# Patient Record
Sex: Female | Born: 1945 | Race: White | Smoking: Never smoker
Health system: Southern US, Community
[De-identification: ages and names within clinical notes are randomized; demographics above are authoritative.]

## PROBLEM LIST (undated history)

## (undated) DIAGNOSIS — F319 Bipolar disorder, unspecified: Secondary | ICD-10-CM

## (undated) DIAGNOSIS — M25512 Pain in left shoulder: Secondary | ICD-10-CM

## (undated) DIAGNOSIS — K589 Irritable bowel syndrome without diarrhea: Secondary | ICD-10-CM

## (undated) DIAGNOSIS — F3289 Other specified depressive episodes: Secondary | ICD-10-CM

## (undated) DIAGNOSIS — K649 Unspecified hemorrhoids: Secondary | ICD-10-CM

## (undated) DIAGNOSIS — M25511 Pain in right shoulder: Secondary | ICD-10-CM

## (undated) DIAGNOSIS — E079 Disorder of thyroid, unspecified: Secondary | ICD-10-CM

## (undated) DIAGNOSIS — F411 Generalized anxiety disorder: Secondary | ICD-10-CM

## (undated) DIAGNOSIS — K219 Gastro-esophageal reflux disease without esophagitis: Secondary | ICD-10-CM

## (undated) DIAGNOSIS — E785 Hyperlipidemia, unspecified: Secondary | ICD-10-CM

## (undated) DIAGNOSIS — Z86010 Personal history of colonic polyps: Secondary | ICD-10-CM

## (undated) DIAGNOSIS — R197 Diarrhea, unspecified: Secondary | ICD-10-CM

## (undated) DIAGNOSIS — F329 Major depressive disorder, single episode, unspecified: Secondary | ICD-10-CM

## (undated) DIAGNOSIS — I10 Essential (primary) hypertension: Secondary | ICD-10-CM

## (undated) HISTORY — DX: Diarrhea, unspecified: R19.7

## (undated) HISTORY — DX: Major depressive disorder, single episode, unspecified: F32.9

## (undated) HISTORY — DX: Essential (primary) hypertension: I10

## (undated) HISTORY — PX: COLONOSCOPY: SHX5424

## (undated) HISTORY — PX: ESOPHAGOGASTRODUODENOSCOPY: SHX1529

## (undated) HISTORY — DX: Gastro-esophageal reflux disease without esophagitis: K21.9

## (undated) HISTORY — DX: Bipolar disorder, unspecified: F31.9

## (undated) HISTORY — DX: Irritable bowel syndrome, unspecified: K58.9

## (undated) HISTORY — DX: Personal history of colonic polyps: Z86.010

## (undated) HISTORY — DX: Pain in right shoulder: M25.511

## (undated) HISTORY — DX: Hyperlipidemia, unspecified: E78.5

## (undated) HISTORY — DX: Disorder of thyroid, unspecified: E07.9

## (undated) HISTORY — DX: Other specified depressive episodes: F32.89

## (undated) HISTORY — DX: Pain in right shoulder: M25.512

## (undated) HISTORY — DX: Generalized anxiety disorder: F41.1

## (undated) HISTORY — DX: Unspecified hemorrhoids: K64.9

---

## 1998-09-06 ENCOUNTER — Other Ambulatory Visit: Admission: RE | Admit: 1998-09-06 | Discharge: 1998-09-06 | Payer: Self-pay | Admitting: Gynecology

## 1999-10-28 ENCOUNTER — Other Ambulatory Visit: Admission: RE | Admit: 1999-10-28 | Discharge: 1999-10-28 | Payer: Self-pay | Admitting: Gynecology

## 2001-01-07 ENCOUNTER — Other Ambulatory Visit: Admission: RE | Admit: 2001-01-07 | Discharge: 2001-01-07 | Payer: Self-pay | Admitting: Gynecology

## 2002-01-27 ENCOUNTER — Other Ambulatory Visit: Admission: RE | Admit: 2002-01-27 | Discharge: 2002-01-27 | Payer: Self-pay | Admitting: Gynecology

## 2003-01-30 ENCOUNTER — Other Ambulatory Visit: Admission: RE | Admit: 2003-01-30 | Discharge: 2003-01-30 | Payer: Self-pay | Admitting: Gynecology

## 2003-08-12 DIAGNOSIS — Z860100 Personal history of colon polyps, unspecified: Secondary | ICD-10-CM

## 2003-08-12 HISTORY — DX: Personal history of colonic polyps: Z86.010

## 2003-08-12 HISTORY — DX: Personal history of colon polyps, unspecified: Z86.0100

## 2004-02-01 ENCOUNTER — Other Ambulatory Visit: Admission: RE | Admit: 2004-02-01 | Discharge: 2004-02-01 | Payer: Self-pay | Admitting: Gynecology

## 2004-02-05 ENCOUNTER — Encounter (INDEPENDENT_AMBULATORY_CARE_PROVIDER_SITE_OTHER): Payer: Self-pay | Admitting: *Deleted

## 2004-03-05 ENCOUNTER — Encounter: Admission: RE | Admit: 2004-03-05 | Discharge: 2004-03-05 | Payer: Self-pay | Admitting: Internal Medicine

## 2004-07-17 ENCOUNTER — Ambulatory Visit: Payer: Self-pay | Admitting: Gastroenterology

## 2004-09-04 ENCOUNTER — Ambulatory Visit: Payer: Self-pay | Admitting: Gastroenterology

## 2005-02-03 ENCOUNTER — Other Ambulatory Visit: Admission: RE | Admit: 2005-02-03 | Discharge: 2005-02-03 | Payer: Self-pay | Admitting: Gynecology

## 2006-04-04 ENCOUNTER — Emergency Department (HOSPITAL_COMMUNITY): Admission: EM | Admit: 2006-04-04 | Discharge: 2006-04-04 | Payer: Self-pay | Admitting: Emergency Medicine

## 2006-05-11 ENCOUNTER — Ambulatory Visit: Payer: Self-pay | Admitting: Gastroenterology

## 2006-06-22 ENCOUNTER — Ambulatory Visit: Payer: Self-pay | Admitting: Gastroenterology

## 2007-01-21 ENCOUNTER — Ambulatory Visit: Payer: Self-pay | Admitting: Gastroenterology

## 2007-01-21 LAB — CONVERTED CEMR LAB: Folate: >20 ng/mL

## 2008-03-14 ENCOUNTER — Telehealth: Payer: Self-pay | Admitting: Internal Medicine

## 2008-05-11 ENCOUNTER — Encounter: Admission: RE | Admit: 2008-05-11 | Discharge: 2008-05-11 | Payer: Self-pay | Admitting: Internal Medicine

## 2008-05-11 ENCOUNTER — Ambulatory Visit: Payer: Self-pay | Admitting: Internal Medicine

## 2008-05-12 ENCOUNTER — Encounter: Admission: RE | Admit: 2008-05-12 | Discharge: 2008-05-12 | Payer: Self-pay | Admitting: Internal Medicine

## 2008-05-23 ENCOUNTER — Other Ambulatory Visit: Admission: RE | Admit: 2008-05-23 | Discharge: 2008-05-23 | Payer: Self-pay | Admitting: Internal Medicine

## 2008-05-23 ENCOUNTER — Ambulatory Visit: Payer: Self-pay | Admitting: Internal Medicine

## 2008-05-29 ENCOUNTER — Ambulatory Visit: Payer: Self-pay | Admitting: Internal Medicine

## 2008-06-09 ENCOUNTER — Ambulatory Visit: Payer: Self-pay | Admitting: Internal Medicine

## 2008-06-09 ENCOUNTER — Other Ambulatory Visit: Admission: RE | Admit: 2008-06-09 | Discharge: 2008-06-09 | Payer: Self-pay | Admitting: Internal Medicine

## 2008-06-19 DIAGNOSIS — F329 Major depressive disorder, single episode, unspecified: Secondary | ICD-10-CM | POA: Insufficient documentation

## 2008-06-19 DIAGNOSIS — Z860100 Personal history of colon polyps, unspecified: Secondary | ICD-10-CM | POA: Insufficient documentation

## 2008-06-19 DIAGNOSIS — F411 Generalized anxiety disorder: Secondary | ICD-10-CM | POA: Insufficient documentation

## 2008-06-19 DIAGNOSIS — R197 Diarrhea, unspecified: Secondary | ICD-10-CM | POA: Insufficient documentation

## 2008-06-19 DIAGNOSIS — K219 Gastro-esophageal reflux disease without esophagitis: Secondary | ICD-10-CM | POA: Insufficient documentation

## 2008-06-19 DIAGNOSIS — F319 Bipolar disorder, unspecified: Secondary | ICD-10-CM | POA: Insufficient documentation

## 2008-06-19 DIAGNOSIS — K649 Unspecified hemorrhoids: Secondary | ICD-10-CM | POA: Insufficient documentation

## 2008-06-20 ENCOUNTER — Telehealth: Payer: Self-pay | Admitting: Gastroenterology

## 2008-06-20 ENCOUNTER — Ambulatory Visit: Payer: Self-pay | Admitting: Gastroenterology

## 2008-06-23 ENCOUNTER — Ambulatory Visit (HOSPITAL_COMMUNITY): Admission: RE | Admit: 2008-06-23 | Discharge: 2008-06-23 | Payer: Self-pay | Admitting: Gastroenterology

## 2008-11-23 ENCOUNTER — Ambulatory Visit: Payer: Self-pay | Admitting: Internal Medicine

## 2009-07-20 ENCOUNTER — Ambulatory Visit: Payer: Self-pay | Admitting: Internal Medicine

## 2009-07-20 ENCOUNTER — Other Ambulatory Visit: Admission: RE | Admit: 2009-07-20 | Discharge: 2009-07-20 | Payer: Self-pay | Admitting: Internal Medicine

## 2010-02-07 ENCOUNTER — Ambulatory Visit: Payer: Self-pay | Admitting: Internal Medicine

## 2010-05-16 ENCOUNTER — Ambulatory Visit: Payer: Self-pay | Admitting: Internal Medicine

## 2010-06-14 ENCOUNTER — Ambulatory Visit: Payer: Self-pay | Admitting: Internal Medicine

## 2010-07-19 ENCOUNTER — Ambulatory Visit: Payer: Self-pay | Admitting: Internal Medicine

## 2010-07-22 ENCOUNTER — Ambulatory Visit: Payer: Self-pay | Admitting: Internal Medicine

## 2010-10-18 ENCOUNTER — Other Ambulatory Visit: Payer: Self-pay | Admitting: Internal Medicine

## 2010-10-21 ENCOUNTER — Encounter (INDEPENDENT_AMBULATORY_CARE_PROVIDER_SITE_OTHER): Payer: Medicare Other | Admitting: Internal Medicine

## 2010-10-21 DIAGNOSIS — Z23 Encounter for immunization: Secondary | ICD-10-CM

## 2010-10-21 DIAGNOSIS — Z0000 Encounter for general adult medical examination without abnormal findings: Secondary | ICD-10-CM

## 2010-11-29 ENCOUNTER — Ambulatory Visit: Payer: Medicare Other | Admitting: Gastroenterology

## 2010-12-19 ENCOUNTER — Ambulatory Visit (INDEPENDENT_AMBULATORY_CARE_PROVIDER_SITE_OTHER): Payer: Medicare Other | Admitting: Gastroenterology

## 2010-12-19 ENCOUNTER — Encounter: Payer: Self-pay | Admitting: Gastroenterology

## 2010-12-19 ENCOUNTER — Other Ambulatory Visit (INDEPENDENT_AMBULATORY_CARE_PROVIDER_SITE_OTHER): Payer: Medicare Other

## 2010-12-19 DIAGNOSIS — R110 Nausea: Secondary | ICD-10-CM

## 2010-12-19 DIAGNOSIS — K589 Irritable bowel syndrome without diarrhea: Secondary | ICD-10-CM

## 2010-12-19 LAB — IGA: IgA: 347 mg/dL (ref 68–378)

## 2010-12-19 MED ORDER — ONDANSETRON HCL 4 MG PO TABS: 4.0000 mg | ORAL_TABLET | Freq: Three times a day (TID) | ORAL | Status: AC | PRN

## 2010-12-19 MED ORDER — HYOSCYAMINE SULFATE 0.125 MG SL SUBL: 0.1250 mg | SUBLINGUAL_TABLET | SUBLINGUAL | Status: AC | PRN

## 2010-12-19 NOTE — Patient Instructions (Signed)
Your prescription(s) have been sent to you pharmacy.  Please go to the basement today for your labs, iFOB.
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
## 2010-12-20 ENCOUNTER — Encounter: Payer: Self-pay | Admitting: Gastroenterology

## 2010-12-20 LAB — GLIA (IGA/G) + TTG IGA
Gliadin IgA: 8.9 U/mL (ref <20)
Gliadin IgG: 5.8 U/mL (ref <20)
Tissue Transglutaminase Ab, IgA: 5 U/mL (ref <20)

## 2010-12-24 ENCOUNTER — Other Ambulatory Visit: Payer: Medicare Other

## 2010-12-27 NOTE — Assessment & Plan Note (Signed)
[facility]                           GASTROENTEROLOGY OFFICE NOTE   NAME:Paccione, [name] [name]                        MRN:          [id number]  DATE:06/22/2006                            DOB:          April 19, 1946    [name] comes in saying she is doing pretty well.  Had a severe episode of  diarrhea in August.  I had lab tests from then showing that her hemoglobin  was 19, when she was amazingly dehydrated.  I am concerned because her  memory does not seem to be great, and her affect and emotions seem to be  really atypical, certainly not the norm.  When she took the Xifaxan and the  probiotic, Flora-Q, she seemed to do better.  She was on a higher dose than  I am going to prescribe for her.   PHYSICAL EXAMINATION:  VITAL SIGNS:  She weighed 143.  Blood pressure  120/72, pulse 80 and regular.  __________ unremarkable.   IMPRESSION:  1. Irritable bowel syndrome with diarrheal component, responding to      Xifaxan and Flora-Q in the past.  2. Anxiety and depression.  3. Gastroesophageal reflux disease.   RECOMMENDATIONS:  1. Use Xifaxan 200 mg b.i.d. x10 days.  2. Flora-Q for several months if need be to get her symptoms under      control.  Sometimes this is effective for irritable bowel syndrome.  3. I have told her to call us if she is not any better or if she has any      episodes like she had when she was in the hospital, where she was      terribly sick and dehydrated and was given 2 units of fluids.  We did      do some repeat lab studies on her today, just to make sure that her      laboratory studies have returned to normal.     Ulyess Mort, MD  Electronically Signed    SML/MedQ  DD: 06/22/2006  DT: 06/22/2006  Job #: [id number]

## 2010-12-27 NOTE — Assessment & Plan Note (Signed)
[name]                           GASTROENTEROLOGY OFFICE NOTE   NAME:[name] [name] JOENS                        MRN:          [id number]  DATE:05/11/2006                            DOB:          [birth year]    PROBLEM:  Diarrhea, gas and bloating.   HISTORY:  [name] [name] is a pleasant 65 year old white female known to Dr. Victorino Dike who has been followed for IBS.  She had undergone colonoscopy in  2005 per Dr. Noe Gens in [facility] for complaints of diarrhea and rectal  discomfort.  She was noted to have internal hemorrhoids which were  coagulated and rubber banded and had two polyps which were hyperplastic.  The patient says that she has had problems with diarrhea over the past 4-5  years and has pretty much chronic symptoms.  She will have 3-4 loose stools  daily, no cramping generally, she does have gas and frequent bloating.  She  had an episode at the end of August 2007 with an abrupt increase in her  symptoms with severe diarrhea, nausea and vomiting.  She attributes this to  a food poisoning and says that her symptoms lasted about 4-5 days and then  have returned to her baseline.  She has not had any recent antibiotic  therapy, no new medications other than Synthroid.  She comes in for followup  after this recent exacerbation and to see if there was anything that would  help with her symptoms.   CURRENT MEDICATIONS:  1. Depakote 250 one p.o. every morning and 2 p.o. every evening.  2. Nortriptyline 75 every night.  3. Fem HRT daily.  4. Propranolol 10 mg daily.  5. Calcium with vitamin D 2 p.o. daily.  6. Temazepam 15 two p.o. nightly.  7. Nefazodone 100 mg two of them nightly.  8. Synthroid she is uncertain of the dosage.   ALLERGIES:  No known drug allergies.   EXAM:  Well-developed white female in no acute distress.  The weight is 144.  Blood pressure 120/80.  Pulse is 78.  CARDIOVASCULAR:  Regular rate and rhythm with S1 and S2.  ABDOMEN:  Is soft, bowel sounds are active.  She is nontender.  There is no  mass or hepatosplenomegaly.   IMPRESSION:  A 65 year old white female with chronic diarrhea with  exacerbation as well as gas and bloating.  I believe she did have a recent  infectious gastroenteritis and may have a component of bacterial overgrowth  aggravating her irritable bowel symptoms.  She has not been ruled out for  celiac disease and this should be done, also wonder about underlying  microscopic colitis.   PLAN:  A trial of Xifaxan 400 mg t.i.d. x10 days and Align 1 p.o. daily.  1. Check celiac panel.  2. Return office visit in 4-6 weeks and depending on her response to the      Xifaxan and Align we will decide regarding repeat colonoscopy with      random biopsies.      ______________________________  Mike Gip, PA-C    ______________________________  Ulyess Mort, MD  AE/MedQ  DD:  05/11/2006  DT:  05/12/2006  Job #:  [id number]

## 2011-01-31 ENCOUNTER — Telehealth: Payer: Self-pay | Admitting: Gastroenterology

## 2011-01-31 ENCOUNTER — Telehealth: Payer: Self-pay | Admitting: Internal Medicine

## 2011-01-31 NOTE — Telephone Encounter (Signed)
Left message for pt to call back.  Pt states that her brother died a couple of days ago and his liver biopsy showed that he had hemochromatosis. Pt would like to be tested for this. Instructed pt to call her primary care physician and discuss this with him and she could have the labs drawn by that office. Pt verbalized understanding.

## 2011-01-31 NOTE — Telephone Encounter (Signed)
Ok per  Dr. Lenord Fellers for patient to come in and have ferritin level drawn. Patient informed.

## 2011-02-03 ENCOUNTER — Other Ambulatory Visit: Payer: Medicare Other | Admitting: Internal Medicine

## 2011-02-03 DIAGNOSIS — R5383 Other fatigue: Secondary | ICD-10-CM

## 2011-02-03 DIAGNOSIS — E039 Hypothyroidism, unspecified: Secondary | ICD-10-CM

## 2011-02-03 DIAGNOSIS — D649 Anemia, unspecified: Secondary | ICD-10-CM

## 2011-02-03 DIAGNOSIS — Z79899 Other long term (current) drug therapy: Secondary | ICD-10-CM

## 2011-02-03 DIAGNOSIS — R5381 Other malaise: Secondary | ICD-10-CM

## 2011-02-03 DIAGNOSIS — Z8349 Family history of other endocrine, nutritional and metabolic diseases: Secondary | ICD-10-CM

## 2011-02-04 ENCOUNTER — Encounter: Payer: Self-pay | Admitting: Internal Medicine

## 2011-02-25 ENCOUNTER — Other Ambulatory Visit: Payer: Self-pay | Admitting: Internal Medicine

## 2011-02-26 ENCOUNTER — Other Ambulatory Visit: Payer: Self-pay

## 2011-02-26 MED ORDER — LEVOTHYROXINE SODIUM 25 MCG PO TABS: 25.0000 ug | ORAL_TABLET | Freq: Every day | ORAL | Status: DC

## 2011-02-26 NOTE — Telephone Encounter (Signed)
rx for synthroid 25 mcg 1 po daily #30/5 refills sent to pharmacy

## 2011-04-28 ENCOUNTER — Encounter: Payer: Self-pay | Admitting: Internal Medicine

## 2011-04-29 ENCOUNTER — Other Ambulatory Visit: Payer: Self-pay | Admitting: Internal Medicine

## 2011-04-29 ENCOUNTER — Ambulatory Visit (INDEPENDENT_AMBULATORY_CARE_PROVIDER_SITE_OTHER): Payer: Medicare Other | Admitting: Internal Medicine

## 2011-04-29 ENCOUNTER — Encounter: Payer: Self-pay | Admitting: Internal Medicine

## 2011-04-29 VITALS — BP 116/60 | HR 72 | Temp 98.6°F | Ht 64.0 in | Wt 127.0 lb

## 2011-04-29 DIAGNOSIS — F319 Bipolar disorder, unspecified: Secondary | ICD-10-CM

## 2011-04-29 DIAGNOSIS — I10 Essential (primary) hypertension: Secondary | ICD-10-CM | POA: Insufficient documentation

## 2011-04-29 DIAGNOSIS — E039 Hypothyroidism, unspecified: Secondary | ICD-10-CM

## 2011-04-29 DIAGNOSIS — E785 Hyperlipidemia, unspecified: Secondary | ICD-10-CM

## 2011-04-29 DIAGNOSIS — Z23 Encounter for immunization: Secondary | ICD-10-CM

## 2011-04-29 LAB — LIPID PANEL
Cholesterol: 238 mg/dL — ABNORMAL HIGH (ref 0–200)
LDL Cholesterol: 134 mg/dL — ABNORMAL HIGH (ref 0–99)
Total CHOL/HDL Ratio: 5.7 Ratio
VLDL: 62 mg/dL — ABNORMAL HIGH (ref 0–40)

## 2011-04-29 LAB — TSH: TSH: 2.465 u[IU]/mL (ref 0.350–4.500)

## 2011-04-29 NOTE — Progress Notes (Signed)
  Subjective:    Patient ID: [name] [name], female    DOB: [birth year], 65 y.o.   MRN: [id number]  HPI For follow up of hypothyroidism, HTN and influenza immunization. Dr. Donell Beers started her on Doxepin for sleep recently. No other complaints other than insomnia. BP stable on Cozaar. TSH drawn today on Synthroid.    Review of Systems     Objective:   Physical Exam Neck-no thyromegaly; Chest clear; Cor RRR        Assessment & Plan:  HTN Hypothyroidism Bipolar disorder RTC in 6 months for PE without PAP. Influenza immunization given today.

## 2011-06-20 ENCOUNTER — Other Ambulatory Visit: Payer: Self-pay | Admitting: Internal Medicine

## 2011-06-23 ENCOUNTER — Other Ambulatory Visit: Payer: Self-pay | Admitting: Internal Medicine

## 2011-07-10 ENCOUNTER — Ambulatory Visit (INDEPENDENT_AMBULATORY_CARE_PROVIDER_SITE_OTHER): Payer: Medicare Other | Admitting: Internal Medicine

## 2011-07-10 ENCOUNTER — Encounter: Payer: Self-pay | Admitting: Internal Medicine

## 2011-07-10 VITALS — BP 116/74 | HR 84 | Temp 98.1°F | Wt 130.0 lb

## 2011-07-10 DIAGNOSIS — J329 Chronic sinusitis, unspecified: Secondary | ICD-10-CM

## 2011-07-10 DIAGNOSIS — J309 Allergic rhinitis, unspecified: Secondary | ICD-10-CM

## 2011-07-10 NOTE — Progress Notes (Signed)
  Subjective:    Patient ID: [name] [name], female    DOB: 1946/04/06, 65 y.o.   MRN: [id number]  HPI 65 year old white female in today for an acute visit. Says she has had nasal congestion and hoarseness for for 5 weeks. Started when least began to fall and heat came on in the home. History of allergic rhinitis. Has had some discolored nasal drainage. Some maxillary sinus tenderness. No fever or chills.    Review of Systems     Objective:   Physical Exam HEENT exam: TMs are clear, pharynx is clear. Neck is supple; chest clear to auscultation; boggy nasal mucosa        Assessment & Plan:  Sinusitis  Allergic rhinitis  Plan: Sample of Veramyst nasal spray to use in each nostril daily. Zithromax Z-PAK 2 tablets by mouth day one followed by 1 tablet by mouth days 2 through 5 with 1 refill. If not better in one week have prescription refilled. Her daughter is expecting twin boys in the next few weeks.

## 2011-07-10 NOTE — Patient Instructions (Signed)
Take Zithromax Z-PAK 2 tablets by mouth day one followed by 1 tablet by mouth days 2 through 5. Have prescription refill in one week if you are not well. Use Veramyst nasal spray in each nostril daily.

## 2011-08-07 ENCOUNTER — Ambulatory Visit (INDEPENDENT_AMBULATORY_CARE_PROVIDER_SITE_OTHER): Payer: Medicare Other | Admitting: Internal Medicine

## 2011-08-07 ENCOUNTER — Encounter: Payer: Self-pay | Admitting: Internal Medicine

## 2011-08-07 VITALS — BP 102/60 | HR 84 | Temp 99.0°F | Wt 127.0 lb

## 2011-08-07 DIAGNOSIS — J40 Bronchitis, not specified as acute or chronic: Secondary | ICD-10-CM

## 2011-08-07 DIAGNOSIS — J111 Influenza due to unidentified influenza virus with other respiratory manifestations: Secondary | ICD-10-CM

## 2011-08-07 NOTE — Progress Notes (Signed)
  Subjective:    Patient ID: [name] [name], female    DOB: 16-Oct-1945, 65 y.o.   MRN: [id number]  HPI Although patient took influenza immunization here in this office,  she came down with flu symptoms this past weekend. Her sister had same illness and they spent time together in [place] exchanging Christmas presents on December 22. Had fever chills myalgias and nausea. Still feels fatigued. Has developed nonproductive cough. Daughter recently delivered twins. She has been reluctant to visit them.    Review of Systems     Objective:   Physical Exam HEENT exam: Pharynx very slightly injected; TMs are clear; neck supple; chest clear        Assessment & Plan:  Influenza  Bronchitis resulting from influenza  Plan: Zithromax Z-Pak 2 tablets by mouth day one followed by 1 tablet by mouth days 2 through 5. Hycodan cough syrup 8 ounces 1 teaspoon by mouth every 6 hours when necessary cough. Call if not better in one week

## 2011-08-07 NOTE — Patient Instructions (Signed)
Take Zithromax Z-PAK 2 tablets on day one followed by 1 tablet on days 2 through 5. Take Hycodan cough syrup every 6 hours if needed for cough. Call if not better in one week or sooner if worse.

## 2011-08-18 ENCOUNTER — Other Ambulatory Visit: Payer: Self-pay | Admitting: Internal Medicine

## 2011-08-20 DIAGNOSIS — Z1231 Encounter for screening mammogram for malignant neoplasm of breast: Secondary | ICD-10-CM | POA: Diagnosis not present

## 2011-09-01 ENCOUNTER — Encounter: Payer: Self-pay | Admitting: Internal Medicine

## 2011-10-29 DIAGNOSIS — F3342 Major depressive disorder, recurrent, in full remission: Secondary | ICD-10-CM | POA: Diagnosis not present

## 2011-10-30 ENCOUNTER — Other Ambulatory Visit: Payer: Medicare Other | Admitting: Internal Medicine

## 2011-10-31 ENCOUNTER — Encounter: Payer: Medicare Other | Admitting: Internal Medicine

## 2011-11-17 DIAGNOSIS — M25519 Pain in unspecified shoulder: Secondary | ICD-10-CM | POA: Diagnosis not present

## 2011-11-19 DIAGNOSIS — M25519 Pain in unspecified shoulder: Secondary | ICD-10-CM | POA: Diagnosis not present

## 2011-11-25 DIAGNOSIS — M25519 Pain in unspecified shoulder: Secondary | ICD-10-CM | POA: Diagnosis not present

## 2011-11-25 DIAGNOSIS — M25559 Pain in unspecified hip: Secondary | ICD-10-CM | POA: Diagnosis not present

## 2011-11-28 ENCOUNTER — Other Ambulatory Visit: Payer: Medicare Other | Admitting: Internal Medicine

## 2011-11-28 DIAGNOSIS — I10 Essential (primary) hypertension: Secondary | ICD-10-CM

## 2011-11-28 DIAGNOSIS — E559 Vitamin D deficiency, unspecified: Secondary | ICD-10-CM | POA: Diagnosis not present

## 2011-11-28 DIAGNOSIS — E785 Hyperlipidemia, unspecified: Secondary | ICD-10-CM | POA: Diagnosis not present

## 2011-11-28 DIAGNOSIS — E7800 Pure hypercholesterolemia, unspecified: Secondary | ICD-10-CM

## 2011-11-28 DIAGNOSIS — M25519 Pain in unspecified shoulder: Secondary | ICD-10-CM | POA: Diagnosis not present

## 2011-11-28 DIAGNOSIS — E039 Hypothyroidism, unspecified: Secondary | ICD-10-CM

## 2011-11-28 DIAGNOSIS — Z0000 Encounter for general adult medical examination without abnormal findings: Secondary | ICD-10-CM

## 2011-11-28 DIAGNOSIS — F411 Generalized anxiety disorder: Secondary | ICD-10-CM

## 2011-11-28 DIAGNOSIS — Z79899 Other long term (current) drug therapy: Secondary | ICD-10-CM

## 2011-11-28 DIAGNOSIS — R110 Nausea: Secondary | ICD-10-CM

## 2011-11-28 DIAGNOSIS — F329 Major depressive disorder, single episode, unspecified: Secondary | ICD-10-CM

## 2011-11-28 DIAGNOSIS — M25559 Pain in unspecified hip: Secondary | ICD-10-CM | POA: Diagnosis not present

## 2011-11-28 DIAGNOSIS — F319 Bipolar disorder, unspecified: Secondary | ICD-10-CM

## 2011-11-29 LAB — CBC WITH DIFFERENTIAL/PLATELET
Basophils Absolute: 0 10*3/uL (ref 0.0–0.1)
Eosinophils Relative: 3 % (ref 0–5)
Lymphocytes Relative: 41 % (ref 12–46)
MCH: 30 pg (ref 26.0–34.0)
MCV: 98.4 fL (ref 78.0–100.0)
Monocytes Absolute: 0.5 10*3/uL (ref 0.1–1.0)
Neutro Abs: 2.6 10*3/uL (ref 1.7–7.7)
Neutrophils Relative %: 46 % (ref 43–77)
Platelets: 213 10*3/uL (ref 150–400)

## 2011-11-29 LAB — LIPID PANEL
Cholesterol: 229 mg/dL — ABNORMAL HIGH (ref 0–200)
Total CHOL/HDL Ratio: 6.4 Ratio
Triglycerides: 414 mg/dL — ABNORMAL HIGH (ref <150)

## 2011-11-29 LAB — COMPREHENSIVE METABOLIC PANEL
ALT: 17 U/L (ref 0–35)
Alkaline Phosphatase: 72 U/L (ref 39–117)
CO2: 28 mEq/L (ref 19–32)
Chloride: 103 mEq/L (ref 96–112)
Creat: 0.94 mg/dL (ref 0.50–1.10)
Sodium: 140 mEq/L (ref 135–145)
Total Protein: 6.6 g/dL (ref 6.0–8.3)

## 2011-12-01 ENCOUNTER — Encounter: Payer: Medicare Other | Admitting: Internal Medicine

## 2011-12-10 DIAGNOSIS — M25519 Pain in unspecified shoulder: Secondary | ICD-10-CM | POA: Diagnosis not present

## 2011-12-17 DIAGNOSIS — M25519 Pain in unspecified shoulder: Secondary | ICD-10-CM | POA: Diagnosis not present

## 2011-12-23 ENCOUNTER — Other Ambulatory Visit: Payer: Self-pay | Admitting: Internal Medicine

## 2011-12-24 ENCOUNTER — Other Ambulatory Visit: Payer: Self-pay

## 2011-12-24 MED ORDER — LOSARTAN POTASSIUM 100 MG PO TABS: 100.0000 mg | ORAL_TABLET | Freq: Every day | ORAL | Status: DC

## 2011-12-29 ENCOUNTER — Other Ambulatory Visit: Payer: Self-pay

## 2011-12-29 MED ORDER — SYNTHROID 25 MCG PO TABS: 25.0000 ug | ORAL_TABLET | Freq: Every day | ORAL | Status: DC

## 2012-01-15 ENCOUNTER — Telehealth: Payer: Self-pay | Admitting: Gastroenterology

## 2012-01-15 NOTE — Telephone Encounter (Signed)
Last OV 12/19/10; hx of IBS/Diarrhea. At that visit she was to take SL Levsin and Zofran PRN. Today pt reports for the past 2 weeks she is experiencing constipation and bloating. She has increased her water intake which has helped. She also states she quit the Fiber Con. Explained to the Fiber Con and increased fiber in her diet cause bloating and gas. Pt reports she will see if the problem clears after stopping all the fiber and call back for further problems.

## 2012-01-19 ENCOUNTER — Ambulatory Visit (INDEPENDENT_AMBULATORY_CARE_PROVIDER_SITE_OTHER): Payer: Medicare Other | Admitting: Internal Medicine

## 2012-01-19 ENCOUNTER — Other Ambulatory Visit (HOSPITAL_COMMUNITY)
Admission: RE | Admit: 2012-01-19 | Discharge: 2012-01-19 | Disposition: A | Discharge status: Home / Self Care | Payer: Medicare Other | Source: Ambulatory Visit | Attending: Internal Medicine | Admitting: Internal Medicine

## 2012-01-19 VITALS — BP 122/64 | HR 70 | Resp 16 | Ht 64.0 in | Wt 129.0 lb

## 2012-01-19 DIAGNOSIS — I10 Essential (primary) hypertension: Secondary | ICD-10-CM

## 2012-01-19 DIAGNOSIS — Z124 Encounter for screening for malignant neoplasm of cervix: Secondary | ICD-10-CM | POA: Diagnosis not present

## 2012-01-19 DIAGNOSIS — Z0000 Encounter for general adult medical examination without abnormal findings: Secondary | ICD-10-CM

## 2012-01-19 DIAGNOSIS — E785 Hyperlipidemia, unspecified: Secondary | ICD-10-CM

## 2012-01-19 DIAGNOSIS — J309 Allergic rhinitis, unspecified: Secondary | ICD-10-CM

## 2012-01-19 DIAGNOSIS — Z23 Encounter for immunization: Secondary | ICD-10-CM

## 2012-01-19 LAB — POCT URINALYSIS DIPSTICK
Glucose, UA: NEGATIVE
Protein, UA: NEGATIVE
Spec Grav, UA: 1.015

## 2012-01-20 ENCOUNTER — Telehealth: Payer: Self-pay | Admitting: Gastroenterology

## 2012-01-20 ENCOUNTER — Encounter: Payer: Self-pay | Admitting: Internal Medicine

## 2012-01-20 NOTE — Telephone Encounter (Signed)
Informed pt that her last COLON was 2005 with hyperplastic polyps and on his last note on 12/19/10, Dr Jarold Motto stated she did not need a COLON at that time. Pt saw Dr Lenord Fellers yesterday and is trying to keep current on everything. Dr Jarold Motto, when do you want pt for a Recall COLON?

## 2012-01-20 NOTE — Patient Instructions (Addendum)
Continue same dose of Synthroid. Take Zithromax Z-PAK for respiratory congestion. Return in 6 months for six-month followup and TSH.

## 2012-01-20 NOTE — Telephone Encounter (Signed)
lmom for pt to call back. Pt has a COLON on 02/05/2004 at [facility] with path showing a hyperplastic polyp.

## 2012-01-21 DIAGNOSIS — M899 Disorder of bone, unspecified: Secondary | ICD-10-CM | POA: Diagnosis not present

## 2012-01-21 DIAGNOSIS — M949 Disorder of cartilage, unspecified: Secondary | ICD-10-CM | POA: Diagnosis not present

## 2012-01-22 MED ORDER — METHYLPREDNISOLONE ACETATE 80 MG/ML IJ SUSP
80.0000 mg | Freq: Once | INTRAMUSCULAR | Status: AC
  Administered 2012-01-22: 80 mg via INTRAMUSCULAR

## 2012-01-25 NOTE — Telephone Encounter (Signed)
Yes,last exam 10y ago

## 2012-01-26 NOTE — Telephone Encounter (Signed)
Informed pt Dr Jarold Motto stated it's fine to schedule for a Direct Colon since she had a hyperplastic  Polyp on last exam. Pt stated understanding and will call to schedule when she gets back in town.

## 2012-02-08 ENCOUNTER — Encounter: Payer: Self-pay | Admitting: Internal Medicine

## 2012-02-08 NOTE — Progress Notes (Signed)
Subjective:    Patient ID: [name] [name], female    DOB: 08-May-1946, 66 y.o.   MRN: [id number]  HPI 66 year old white female with history of bipolar disorder, GE reflux, allergic rhinitis, hypothyroidism for six-month recheck. TSH drawn. History of hyperlipidemia. Patient had normal TSH in April of 2.947. However total cholesterol was 229 and triglycerides were 414. LDL could not be calculated. Patient was thought to be fasting at the time but it could be that she was not fasting for 12 hours. She has a history of hypertension treated with losartan. Is on Synthroid 0.025 mg daily. Also takes Restoril Desyrel and Pamelor as well as gabapentin for bipolar disorder.  Also has new complaint of URI. Complains of sinus congestion and discolored nasal drainage. No fever or chills. No significant cough.  Social history: Patient does not smoke or consume alcohol. She is divorced and used to work as a Runner, broadcasting/film/video. Currently not working. Completed 4 years of college. Resides alone.  Family history: One brother with history of hypertension, mother with history of "nervous breakdown" father with history of hypertension.  Additional history: Has seen Dr. Jethro Bolus   for allergic rhinitis and has had positive skin reactions to dust mold and dust mites. History of GE reflux. Psychiatrist is Dr. Donell Beers.  History of superficial erosions in the gastric antrum in 1997 as well as the duodenal bulb. CLOtest was negative. Had yellowish plaques in the middle and proximal esophagus. Biopsies and brushings were taken for Candida and patient was started on Prilosec. No evidence of malignancy. Patient had intestinal metaplasia. Candida was observed on PAS stain and patient was treated with Diflucan. Stress seems to abate [place] we'll bowel symptoms.  Remote history of fractured wrist.    Review of Systems  Constitutional: Negative.   HENT: Positive for congestion and postnasal drip.   Eyes: Negative.   Respiratory:  Negative.   Cardiovascular: Negative.   Gastrointestinal: Negative.   Genitourinary: Negative.   Musculoskeletal: Negative.   Neurological: Negative.   Hematological: Negative.   Psychiatric/Behavioral: Negative.    history of anxiety, allergic rhinitis, history of colonic polyps status post colonoscopy 02/05/2004 by Dr. Jarold Motto. Had tetanus immunization 2004, Pneumovax March 2012.     Objective:   Physical Exam  Vitals reviewed. Constitutional: She is oriented to person, place, and time. She appears well-nourished. No distress.  HENT:  Head: Normocephalic and atraumatic.  Right Ear: External ear normal.  Left Ear: External ear normal.  Mouth/Throat: Oropharynx is clear and moist.  Eyes: Conjunctivae and EOM are normal. Pupils are equal, round, and reactive to light.  Neck: Neck supple. No JVD present. No thyromegaly present.  Cardiovascular: Normal rate, regular rhythm, normal heart sounds and intact distal pulses.   No murmur heard. Pulmonary/Chest: Effort normal and breath sounds normal. No respiratory distress. She has no rales.       Breasts  normal.  Abdominal: Soft. Bowel sounds are normal. She exhibits no distension and no mass. There is tenderness. There is no rebound and no guarding.  Genitourinary: Vagina normal and uterus normal.  Musculoskeletal: Normal range of motion. She exhibits no edema and no tenderness.  Lymphadenopathy:    She has no cervical adenopathy.  Neurological: She is alert and oriented to person, place, and time. She has normal reflexes. She displays normal reflexes. No cranial nerve deficit. She exhibits normal muscle tone. Coordination normal.  Skin: Skin is warm and dry. No rash noted. She is not diaphoretic. No erythema. No pallor.  Psychiatric:  She has a normal mood and affect. Her behavior is normal. Judgment and thought content normal.           Assessment & Plan:  URI  Hypothyroidism  Bipolar disorder  Hypertension  Allergic  rhinitis  Hyperlipidemia  Plan: Zithromax Z-Pak take 2 tablets day one followed by 1 tablet days 2 through 5. Continue same medications and return in 6 months for repeat lipid panel, office visit, TSH.

## 2012-02-20 DIAGNOSIS — Z85828 Personal history of other malignant neoplasm of skin: Secondary | ICD-10-CM | POA: Diagnosis not present

## 2012-02-20 DIAGNOSIS — L570 Actinic keratosis: Secondary | ICD-10-CM | POA: Diagnosis not present

## 2012-02-20 DIAGNOSIS — D239 Other benign neoplasm of skin, unspecified: Secondary | ICD-10-CM | POA: Diagnosis not present

## 2012-02-20 DIAGNOSIS — L819 Disorder of pigmentation, unspecified: Secondary | ICD-10-CM | POA: Diagnosis not present

## 2012-02-20 DIAGNOSIS — L821 Other seborrheic keratosis: Secondary | ICD-10-CM | POA: Diagnosis not present

## 2012-03-01 DIAGNOSIS — F3342 Major depressive disorder, recurrent, in full remission: Secondary | ICD-10-CM | POA: Diagnosis not present

## 2012-04-01 ENCOUNTER — Encounter: Payer: Self-pay | Admitting: Internal Medicine

## 2012-04-01 ENCOUNTER — Ambulatory Visit (INDEPENDENT_AMBULATORY_CARE_PROVIDER_SITE_OTHER): Payer: Medicare Other | Admitting: Internal Medicine

## 2012-04-01 VITALS — BP 124/76 | HR 76 | Temp 98.7°F | Ht 64.0 in | Wt 128.5 lb

## 2012-04-01 DIAGNOSIS — K644 Residual hemorrhoidal skin tags: Secondary | ICD-10-CM | POA: Diagnosis not present

## 2012-04-01 NOTE — Progress Notes (Signed)
  Subjective:    Patient ID: [name] [name], female    DOB: [birth year], 66 y.o.   MRN: [id number]  HPI 66 year old white female with history of intermittent constipation for which she takes stool softener and laxative occasionally. Has external hemorrhoid that has been bothering her for about a week and a half. Says a number of years ago Dr. Scotty Court incised thrombosed hemorrhoid for her but this has not been a recent event for her. Hemorrhoid prescribed today is painful. No bleeding.    Review of Systems     Objective:   Physical Exam she has an external hemorrhoid tag at 11:00 that is nonthrombosed. It is soft to touch but tender. No bleeding noted. Other external hemorrhoid tags present surrounding the rectal area.        Assessment & Plan:   External hemorrhoid  Plan: ProctoCream-HC to use in rectal area 4 times a day for 7-10 days with refills. Recommend sitz baths for 20 minutes daily for 3-5 days.

## 2012-04-01 NOTE — Patient Instructions (Addendum)
Recommend sitz baths for 3-5 days 20 minutes each in warm soapy water. Apply ProctoCream-HC to use in rectal area 4 times daily until improved. Call if not better in 2 weeks

## 2012-04-19 ENCOUNTER — Other Ambulatory Visit: Payer: Medicare Other | Admitting: Internal Medicine

## 2012-04-19 DIAGNOSIS — Z79899 Other long term (current) drug therapy: Secondary | ICD-10-CM

## 2012-04-19 DIAGNOSIS — E785 Hyperlipidemia, unspecified: Secondary | ICD-10-CM | POA: Diagnosis not present

## 2012-04-19 LAB — HEPATIC FUNCTION PANEL
ALT: 20 U/L (ref 0–35)
Total Protein: 6.8 g/dL (ref 6.0–8.3)

## 2012-04-19 LAB — LIPID PANEL
Cholesterol: 142 mg/dL (ref 0–200)
Total CHOL/HDL Ratio: 3.2 Ratio
Triglycerides: 245 mg/dL — ABNORMAL HIGH (ref <150)
VLDL: 49 mg/dL — ABNORMAL HIGH (ref 0–40)

## 2012-04-20 ENCOUNTER — Encounter: Payer: Self-pay | Admitting: Internal Medicine

## 2012-04-20 ENCOUNTER — Ambulatory Visit (INDEPENDENT_AMBULATORY_CARE_PROVIDER_SITE_OTHER): Payer: Medicare Other | Admitting: Internal Medicine

## 2012-04-20 VITALS — BP 130/76 | HR 80 | Temp 98.0°F | Ht 64.0 in | Wt 128.5 lb

## 2012-04-20 DIAGNOSIS — E785 Hyperlipidemia, unspecified: Secondary | ICD-10-CM

## 2012-04-20 DIAGNOSIS — Z23 Encounter for immunization: Secondary | ICD-10-CM | POA: Diagnosis not present

## 2012-04-20 DIAGNOSIS — I10 Essential (primary) hypertension: Secondary | ICD-10-CM

## 2012-04-20 DIAGNOSIS — E039 Hypothyroidism, unspecified: Secondary | ICD-10-CM | POA: Diagnosis not present

## 2012-04-20 DIAGNOSIS — Z8659 Personal history of other mental and behavioral disorders: Secondary | ICD-10-CM

## 2012-04-20 NOTE — Progress Notes (Signed)
  Subjective:    Patient ID: [name] [name], female    DOB: [birth year], 66 y.o.   MRN: [id number]  HPI  In today for follow up of hyperlipidemia on Lipitor 10 mg daily. Marked improvement with Lipitor 10 mg daily. We are both pleased with that. Remains on same dose of Synthroid. Blood pressure under good control. History of bipolar disorder. Seems to be doing well. No side effects with Lipitor    Review of Systems     Objective:   Physical Exam HEENT exam: TMs are clear. Pharynx is clear. Neck is supple without thyromegaly. Chest clear to auscultation. Cardiac exam regular rate and rhythm normal S1 and S2. Extremities without edema.        Assessment & Plan:   Hypertension-stable  Hyperlipidemia-improved on Lipitor 10 mg daily  Hypothyroidism-stable on thyroid replacement therapy  Plan: See lab results regarding improvement in lipid panel with small dose of Lipitor. Continue same dose of Lipitor 10 mg daily and return for physical examination in 4-6 months.

## 2012-05-09 NOTE — Patient Instructions (Addendum)
Continue same medications and return in 4-6 months for physical exam.

## 2012-05-31 DIAGNOSIS — F3342 Major depressive disorder, recurrent, in full remission: Secondary | ICD-10-CM | POA: Diagnosis not present

## 2012-06-01 ENCOUNTER — Other Ambulatory Visit: Payer: Self-pay | Admitting: Internal Medicine

## 2012-06-15 DIAGNOSIS — J3089 Other allergic rhinitis: Secondary | ICD-10-CM | POA: Diagnosis not present

## 2012-06-25 DIAGNOSIS — M67919 Unspecified disorder of synovium and tendon, unspecified shoulder: Secondary | ICD-10-CM | POA: Diagnosis not present

## 2012-06-25 DIAGNOSIS — M25519 Pain in unspecified shoulder: Secondary | ICD-10-CM | POA: Diagnosis not present

## 2012-06-30 ENCOUNTER — Other Ambulatory Visit: Payer: Self-pay | Admitting: Internal Medicine

## 2012-07-22 DIAGNOSIS — H2589 Other age-related cataract: Secondary | ICD-10-CM | POA: Diagnosis not present

## 2012-08-20 DIAGNOSIS — Z1231 Encounter for screening mammogram for malignant neoplasm of breast: Secondary | ICD-10-CM | POA: Diagnosis not present

## 2012-09-16 DIAGNOSIS — J3089 Other allergic rhinitis: Secondary | ICD-10-CM | POA: Diagnosis not present

## 2012-09-20 ENCOUNTER — Other Ambulatory Visit: Payer: Medicare Other | Admitting: Internal Medicine

## 2012-09-20 DIAGNOSIS — E785 Hyperlipidemia, unspecified: Secondary | ICD-10-CM

## 2012-09-20 DIAGNOSIS — E039 Hypothyroidism, unspecified: Secondary | ICD-10-CM | POA: Diagnosis not present

## 2012-09-20 DIAGNOSIS — Z79899 Other long term (current) drug therapy: Secondary | ICD-10-CM | POA: Diagnosis not present

## 2012-09-20 LAB — LIPID PANEL
Cholesterol: 145 mg/dL (ref 0–200)
HDL: 42 mg/dL (ref >39)
Total CHOL/HDL Ratio: 3.5 Ratio
Triglycerides: 215 mg/dL — ABNORMAL HIGH (ref <150)
VLDL: 43 mg/dL — ABNORMAL HIGH (ref 0–40)

## 2012-09-20 LAB — TSH: TSH: 2.801 u[IU]/mL (ref 0.350–4.500)

## 2012-09-20 LAB — HEPATIC FUNCTION PANEL
ALT: 22 U/L (ref 0–35)
AST: 23 U/L (ref 0–37)
Albumin: 4.3 g/dL (ref 3.5–5.2)
Bilirubin, Direct: 0.1 mg/dL (ref 0.0–0.3)
Total Protein: 6.8 g/dL (ref 6.0–8.3)

## 2012-09-21 ENCOUNTER — Encounter: Payer: Self-pay | Admitting: Internal Medicine

## 2012-09-21 ENCOUNTER — Ambulatory Visit (INDEPENDENT_AMBULATORY_CARE_PROVIDER_SITE_OTHER): Payer: Medicare Other | Admitting: Internal Medicine

## 2012-09-21 VITALS — BP 128/72 | HR 76 | Temp 97.9°F | Wt 130.0 lb

## 2012-09-21 DIAGNOSIS — Z8659 Personal history of other mental and behavioral disorders: Secondary | ICD-10-CM

## 2012-09-21 DIAGNOSIS — E785 Hyperlipidemia, unspecified: Secondary | ICD-10-CM | POA: Diagnosis not present

## 2012-09-21 DIAGNOSIS — I10 Essential (primary) hypertension: Secondary | ICD-10-CM

## 2012-09-21 DIAGNOSIS — E039 Hypothyroidism, unspecified: Secondary | ICD-10-CM

## 2012-09-21 NOTE — Progress Notes (Signed)
  Subjective:    Patient ID: [name] [name], female    DOB: 09-Nov-1945, 67 y.o.   MRN: [id number]  HPI 67 year old white female with history of hypertension, hypothyroidism, hyperlipidemia in today for six-month recheck. About 4 months ago had problems with left shoulder went to see Dr. Althea Charon who gave her a steroid injection. It worked well for some time but now having recurrent issues with left shoulder pain. She may need to return to see him for evaluation. With regard to hypertension blood pressure is well controlled on Cozaar. TSH is normal on current dose of Synthroid. Lipid panel is excellent on Lipitor 10 mg daily. She's got her triglycerides down to 215 from 245 several months ago. 9 months ago triglycerides were 414. We could increase Lipitor to 20 mg daily but she feels well on this dose so I think we will just continue to observe on Lipitor 10 mg daily.  She is also concerned about her recent mammogram. She was told she had dense breast. She did not pay an extra $50 to have special mammogram study done. She has no family history of breast cancer has never had an abnormal mammogram.    Review of Systems     Objective:   Physical Exam neck is supple without thyromegaly. Chest is clear to auscultation. Cardiac exam regular rate and rhythm normal S1 and S2. Extremities without edema. Skin is warm and dry. She is alert and oriented x3. 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

## 2012-09-21 NOTE — Patient Instructions (Addendum)
Continue same medications and return for physical exam in 6 months 

## 2012-10-01 ENCOUNTER — Ambulatory Visit: Payer: Medicare Other | Admitting: Internal Medicine

## 2012-10-18 ENCOUNTER — Telehealth: Payer: Self-pay | Admitting: Internal Medicine

## 2012-10-18 MED ORDER — PROMETHAZINE HCL 25 MG PO TABS: 25.0000 mg | ORAL_TABLET | Freq: Three times a day (TID) | ORAL | Status: DC | PRN

## 2012-10-18 NOTE — Telephone Encounter (Signed)
Patient not vomiting anymore, just very nauseated. Advised clear liquids only today. Call tomorrow if no better

## 2012-10-18 NOTE — Telephone Encounter (Signed)
Call in Phenergan 25 mg tablets #30. Does she need OV?

## 2012-10-26 ENCOUNTER — Other Ambulatory Visit: Payer: Self-pay

## 2012-10-26 MED ORDER — SYNTHROID 25 MCG PO TABS: 25.0000 ug | ORAL_TABLET | Freq: Every day | ORAL | Status: DC

## 2012-10-29 DIAGNOSIS — F3342 Major depressive disorder, recurrent, in full remission: Secondary | ICD-10-CM | POA: Diagnosis not present

## 2012-11-04 ENCOUNTER — Ambulatory Visit (INDEPENDENT_AMBULATORY_CARE_PROVIDER_SITE_OTHER): Payer: Medicare Other | Admitting: Gastroenterology

## 2012-11-04 ENCOUNTER — Encounter: Payer: Self-pay | Admitting: Gastroenterology

## 2012-11-04 ENCOUNTER — Other Ambulatory Visit: Payer: Medicare Other

## 2012-11-04 VITALS — BP 118/78 | HR 76 | Ht 64.0 in | Wt 126.0 lb

## 2012-11-04 DIAGNOSIS — K589 Irritable bowel syndrome without diarrhea: Secondary | ICD-10-CM

## 2012-11-04 DIAGNOSIS — R112 Nausea with vomiting, unspecified: Secondary | ICD-10-CM

## 2012-11-04 MED ORDER — MOVIPREP 100 G PO SOLR: 1.0000 | Freq: Once | ORAL | Status: DC

## 2012-11-04 MED ORDER — CILIDINIUM-CHLORDIAZEPOXIDE 2.5-5 MG PO CAPS: 1.0000 | ORAL_CAPSULE | Freq: Three times a day (TID) | ORAL | Status: DC | PRN

## 2012-11-04 MED ORDER — ONDANSETRON HCL 4 MG PO TABS: 4.0000 mg | ORAL_TABLET | Freq: Three times a day (TID) | ORAL | Status: DC | PRN

## 2012-11-04 NOTE — Progress Notes (Signed)
This is a very nice 67 year old Caucasian female with chronic irritable bowel syndrome exacerbated by flares of PTSD syndrome.  She has periodic crampy diarrhea, periodic nausea and vomiting, and is intolerant to greasy and fatty foods.  She's really done fairly well over the last several years until her recent flare of her PTSD disease.  She is under psychiatric evaluation and care.  She denies rectal bleeding, melena, hematemesis, or any specific otherwise upper GI or hepatobiliary complaints.  Her appetite is good and her weight is stable. Current Medications, Allergies, Past Medical History, Past Surgical History, Family History and Social History were reviewed in [employer] record.  ROS: All systems were reviewed and are negative unless otherwise stated in the HPI.          Physical Exam: At pressure 118/78, pulse 76 and regular, weight 126 with a BMI of 21.62.  Her abdomen shows no organomegaly, masses or tenderness.  Bowel sounds are normal.  No status is normal.    Assessment and Plan: IBS flare associated with PTSD psychological difficulties.  I've given her when necessary Zofran 4 mg every 12 hours for nausea and vomiting, when necessary Librax for abdominal spasms, have prescribed a FOD-MAP diet for IBS, and we'll check celiac serum antibody levels at her request.  Please copy this note to Dr. Eden Emms Baxley.  I have recommended that she have followup colonoscopy was last done 10 years ago. Encounter Diagnosis  Name Primary?  . IBS (irritable bowel syndrome) Yes

## 2012-11-04 NOTE — Patient Instructions (Addendum)
  We have sent the following medications to your pharmacy for you to pick up at your convenience: Zofran and Librax, please take as directed.  FOD-MAP diet given today, please follow.  It has been recommended to you by your physician that you have a(n) Colonoscopy completed. Per your request, we did not schedule the procedure(s) today. Please contact our office at [phone] should you decide to have the procedure completed.  _____________________________________________________________________________________________________________                                               We are excited to introduce MyChart, a new best-in-class service that provides you online access to important information in your electronic medical record. We want to make it easier for you to view your health information - all in one secure location - when and where you need it. We expect MyChart will enhance the quality of care and service we provide.  When you register for MyChart, you can:    View your test results.    Request appointments and receive appointment reminders via email.    Request medication renewals.    View your medical history, allergies, medications and immunizations.    Communicate with your physician's office through a password-protected site.    Conveniently print information such as your medication lists.  To find out if MyChart is right for you, please talk to a member of our clinical staff today. We will gladly answer your questions about this free health and wellness tool.  If you are age 68 or older and want a member of your family to have access to your record, you must provide written consent by completing a proxy form available at our office. Please speak to our clinical staff about guidelines regarding accounts for patients younger than age 68.  As you activate your MyChart account and need any technical assistance, please call the MyChart technical support line at [phone]  [phone] [phone]) or email your question to [email or web address]. If you email your question(s), please include your name, a return phone number and the best time to reach you.  If you have non-urgent health-related questions, you can send a message to our office through MyChart at [email or web address]. If you have a medical emergency, call 911.  Thank you for using MyChart as your new health and wellness resource!   MyChart licensed from [employer],  [id number]. Patents Pending.

## 2012-11-05 LAB — CELIAC PANEL 10
Endomysial Screen: NEGATIVE
Gliadin IgA: 6.3 U/mL (ref <20)
Gliadin IgG: 6 U/mL (ref <20)
IgA: 433 mg/dL — ABNORMAL HIGH (ref 69–380)
Tissue Transglutaminase Ab, IgA: 5.2 U/mL (ref <20)

## 2012-11-16 DIAGNOSIS — M25819 Other specified joint disorders, unspecified shoulder: Secondary | ICD-10-CM | POA: Diagnosis not present

## 2012-11-19 DIAGNOSIS — M25519 Pain in unspecified shoulder: Secondary | ICD-10-CM | POA: Diagnosis not present

## 2012-11-19 DIAGNOSIS — M25819 Other specified joint disorders, unspecified shoulder: Secondary | ICD-10-CM | POA: Diagnosis not present

## 2012-11-22 ENCOUNTER — Encounter: Payer: Self-pay | Admitting: Gastroenterology

## 2012-11-22 ENCOUNTER — Ambulatory Visit: Payer: Self-pay | Admitting: Internal Medicine

## 2012-11-23 ENCOUNTER — Ambulatory Visit (INDEPENDENT_AMBULATORY_CARE_PROVIDER_SITE_OTHER): Payer: Medicare Other | Admitting: Internal Medicine

## 2012-11-23 ENCOUNTER — Encounter: Payer: Self-pay | Admitting: Internal Medicine

## 2012-11-23 VITALS — BP 120/62 | Temp 98.3°F | Wt 131.0 lb

## 2012-11-23 DIAGNOSIS — J069 Acute upper respiratory infection, unspecified: Secondary | ICD-10-CM | POA: Diagnosis not present

## 2012-11-23 NOTE — Progress Notes (Signed)
  Subjective:    Patient ID: [name] [name], female    DOB: 02/12/1946, 67 y.o.   MRN: [id number]  HPI 67 year old white female with history of bipolar disorder and hypothyroidism in today with acute URI symptoms. Patient says she's had no fever but lots of cough and congestion. Has been around grandchildren that have been sick. Thick discolored nasal drainage.    Review of Systems     Objective:   Physical Exam sounds nasally congested when she speaks. Pharynx slightly injected. TMs are clear. Neck is supple. Chest clear.        Assessment & Plan:  Acute URI  Plan: Zithromax Z-Pak take 2 tablets day one followed by 1 tablet days 2 through 5 with 1 refill. If not better in one week have prescription refilled

## 2012-11-30 DIAGNOSIS — M25519 Pain in unspecified shoulder: Secondary | ICD-10-CM | POA: Diagnosis not present

## 2012-11-30 DIAGNOSIS — M25819 Other specified joint disorders, unspecified shoulder: Secondary | ICD-10-CM | POA: Diagnosis not present

## 2012-12-02 DIAGNOSIS — M25519 Pain in unspecified shoulder: Secondary | ICD-10-CM | POA: Diagnosis not present

## 2012-12-02 DIAGNOSIS — M25819 Other specified joint disorders, unspecified shoulder: Secondary | ICD-10-CM | POA: Diagnosis not present

## 2012-12-06 DIAGNOSIS — M25819 Other specified joint disorders, unspecified shoulder: Secondary | ICD-10-CM | POA: Diagnosis not present

## 2012-12-06 DIAGNOSIS — M25519 Pain in unspecified shoulder: Secondary | ICD-10-CM | POA: Diagnosis not present

## 2012-12-07 ENCOUNTER — Telehealth: Payer: Self-pay | Admitting: Gastroenterology

## 2012-12-07 ENCOUNTER — Ambulatory Visit (AMBULATORY_SURGERY_CENTER): Payer: Medicare Other

## 2012-12-07 VITALS — Ht 64.0 in | Wt 129.2 lb

## 2012-12-07 DIAGNOSIS — Z86010 Personal history of colonic polyps: Secondary | ICD-10-CM

## 2012-12-07 DIAGNOSIS — Z1211 Encounter for screening for malignant neoplasm of colon: Secondary | ICD-10-CM

## 2012-12-07 MED ORDER — MOVIPREP 100 G PO SOLR: ORAL | Status: DC

## 2012-12-07 NOTE — Telephone Encounter (Signed)
Moviprep bowel prep-1 kit,take as directed was called into [facility] per patient's request. Pt was notified.

## 2012-12-09 DIAGNOSIS — M25519 Pain in unspecified shoulder: Secondary | ICD-10-CM | POA: Diagnosis not present

## 2012-12-09 DIAGNOSIS — M25819 Other specified joint disorders, unspecified shoulder: Secondary | ICD-10-CM | POA: Diagnosis not present

## 2012-12-12 NOTE — Patient Instructions (Addendum)
Take Zithromax Z-Pak 2 tablets day one followed by 1 tablet days 2 through 5. If not better in one week have prescription refilled 

## 2012-12-21 DIAGNOSIS — M25519 Pain in unspecified shoulder: Secondary | ICD-10-CM | POA: Diagnosis not present

## 2012-12-21 DIAGNOSIS — M25819 Other specified joint disorders, unspecified shoulder: Secondary | ICD-10-CM | POA: Diagnosis not present

## 2012-12-29 ENCOUNTER — Encounter: Payer: Self-pay | Admitting: Gastroenterology

## 2012-12-29 ENCOUNTER — Ambulatory Visit (AMBULATORY_SURGERY_CENTER): Payer: Medicare Other | Admitting: Gastroenterology

## 2012-12-29 VITALS — BP 98/57 | HR 73 | Temp 97.5°F | Resp 16 | Ht 64.0 in | Wt 129.0 lb

## 2012-12-29 DIAGNOSIS — E079 Disorder of thyroid, unspecified: Secondary | ICD-10-CM | POA: Diagnosis not present

## 2012-12-29 DIAGNOSIS — F319 Bipolar disorder, unspecified: Secondary | ICD-10-CM | POA: Diagnosis not present

## 2012-12-29 DIAGNOSIS — I10 Essential (primary) hypertension: Secondary | ICD-10-CM | POA: Diagnosis not present

## 2012-12-29 DIAGNOSIS — K589 Irritable bowel syndrome without diarrhea: Secondary | ICD-10-CM

## 2012-12-29 DIAGNOSIS — D126 Benign neoplasm of colon, unspecified: Secondary | ICD-10-CM

## 2012-12-29 DIAGNOSIS — Z86010 Personal history of colonic polyps: Secondary | ICD-10-CM

## 2012-12-29 DIAGNOSIS — K6389 Other specified diseases of intestine: Secondary | ICD-10-CM | POA: Diagnosis not present

## 2012-12-29 DIAGNOSIS — Z1211 Encounter for screening for malignant neoplasm of colon: Secondary | ICD-10-CM | POA: Diagnosis not present

## 2012-12-29 MED ORDER — SODIUM CHLORIDE 0.9 % IV SOLN: 500.0000 mL | INTRAVENOUS | Status: DC

## 2012-12-29 NOTE — Progress Notes (Signed)
Patient did not experience any of the following events: a burn prior to discharge; a fall within the facility; wrong site/side/patient/procedure/implant event; or a hospital transfer or hospital admission upon discharge from the facility. (G8907) Patient did not have preoperative order for IV antibiotic SSI prophylaxis. (G8918)  

## 2012-12-29 NOTE — Op Note (Signed)
[facility] [address] [place], [address]   COLONOSCOPY PROCEDURE REPORT  PATIENT: Midori, Dado  MR#: [id number] BIRTHDATE: Sep 08, 1945 , 66  yrs. old GENDER: Female ENDOSCOPIST: Mardella Layman, MD, [facility] REFERRED BY: PROCEDURE DATE:  12/29/2012 PROCEDURE:   Colonoscopy with biopsy ASA CLASS:   Class II INDICATIONS:Average risk patient for colon cancer. MEDICATIONS: propofol (Diprivan) 200mg  IV  DESCRIPTION OF PROCEDURE:   After the risks and benefits and of the procedure were explained, informed consent was obtained.  A digital rectal exam revealed no abnormalities of the rectum.    The LB [id number]  endoscope was introduced through the anus and advanced to the cecum, which was identified by both the appendix and ileocecal valve .  The quality of the prep was good, using MoviPrep .  The instrument was then slowly withdrawn as the colon was fully examined.     COLON FINDINGS: A normal appearing cecum, ileocecal valve, and appendiceal orifice were identified.  The ascending, hepatic flexure, transverse, splenic flexure, descending, sigmoid colon and rectum appeared unremarkable.  No polyps or cancers were seen. Multiple biopsies os small rectal nodules was performed.  Mucosal changes of melanosis coli noted     Retroflexed views revealed no abnormalities.     The scope was then withdrawn from the patient and the procedure completed.  COMPLICATIONS: There were no complications. ENDOSCOPIC IMPRESSION: Normal colon; multiple biopsies of the rectal nodules lesion was performed ..probable hyperplastic nodules.Melanosis coli also noted.  RECOMMENDATIONS: 1.  Repeat colonoscopy in 5 years if polyp adenomatous; otherwise 10 years 2.  Await pathology results 3.  Continue current medications   REPEAT EXAM:  cc:Sharlet Salina, MD  _______________________________ eSigned:  Mardella Layman, MD, [facility] 12/29/2012 11:47 AM

## 2012-12-29 NOTE — Patient Instructions (Addendum)

## 2012-12-29 NOTE — Progress Notes (Signed)
Called to room to assist during endoscopic procedure.  Patient ID and intended procedure confirmed with present staff. Received instructions for my participation in the procedure from the performing physician.  

## 2012-12-30 ENCOUNTER — Telehealth: Payer: Self-pay

## 2012-12-30 NOTE — Telephone Encounter (Signed)
  Follow up Call-  Call back number 12/29/2012  Post procedure Call Back phone  # [phone]  Permission to leave phone message Yes     Patient questions:  Do you have a fever, pain , or abdominal swelling? no Pain Score  0 *  Have you tolerated food without any problems? yes  Have you been able to return to your normal activities? yes  Do you have any questions about your discharge instructions: Diet   no Medications  no Follow up visit  no  Do you have questions or concerns about your Care? no  Actions: * If pain score is 4 or above: No action needed, pain <4.   Per the pt, "I feel really good". Maw

## 2012-12-31 DIAGNOSIS — M25819 Other specified joint disorders, unspecified shoulder: Secondary | ICD-10-CM | POA: Diagnosis not present

## 2012-12-31 DIAGNOSIS — M25519 Pain in unspecified shoulder: Secondary | ICD-10-CM | POA: Diagnosis not present

## 2013-01-04 ENCOUNTER — Encounter: Payer: Self-pay | Admitting: Gastroenterology

## 2013-02-21 DIAGNOSIS — Z85828 Personal history of other malignant neoplasm of skin: Secondary | ICD-10-CM | POA: Diagnosis not present

## 2013-02-21 DIAGNOSIS — D239 Other benign neoplasm of skin, unspecified: Secondary | ICD-10-CM | POA: Diagnosis not present

## 2013-02-21 DIAGNOSIS — D1801 Hemangioma of skin and subcutaneous tissue: Secondary | ICD-10-CM | POA: Diagnosis not present

## 2013-02-21 DIAGNOSIS — L570 Actinic keratosis: Secondary | ICD-10-CM | POA: Diagnosis not present

## 2013-02-21 DIAGNOSIS — W57XXXA Bitten or stung by nonvenomous insect and other nonvenomous arthropods, initial encounter: Secondary | ICD-10-CM | POA: Diagnosis not present

## 2013-02-21 DIAGNOSIS — L821 Other seborrheic keratosis: Secondary | ICD-10-CM | POA: Diagnosis not present

## 2013-02-21 DIAGNOSIS — T148 Other injury of unspecified body region: Secondary | ICD-10-CM | POA: Diagnosis not present

## 2013-03-04 DIAGNOSIS — F3342 Major depressive disorder, recurrent, in full remission: Secondary | ICD-10-CM | POA: Diagnosis not present

## 2013-03-21 ENCOUNTER — Other Ambulatory Visit: Payer: Medicare Other | Admitting: Internal Medicine

## 2013-03-22 ENCOUNTER — Encounter: Payer: Medicare Other | Admitting: Internal Medicine

## 2013-04-04 ENCOUNTER — Other Ambulatory Visit: Payer: Medicare Other | Admitting: Internal Medicine

## 2013-04-04 DIAGNOSIS — Z13220 Encounter for screening for lipoid disorders: Secondary | ICD-10-CM

## 2013-04-04 DIAGNOSIS — I10 Essential (primary) hypertension: Secondary | ICD-10-CM

## 2013-04-04 DIAGNOSIS — E039 Hypothyroidism, unspecified: Secondary | ICD-10-CM | POA: Diagnosis not present

## 2013-04-04 DIAGNOSIS — Z780 Asymptomatic menopausal state: Secondary | ICD-10-CM | POA: Diagnosis not present

## 2013-04-04 DIAGNOSIS — Z130 Encounter for screening for diseases of the blood and blood-forming organs and certain disorders involving the immune mechanism: Secondary | ICD-10-CM | POA: Diagnosis not present

## 2013-04-04 LAB — CBC WITH DIFFERENTIAL/PLATELET
Eosinophils Absolute: 0.1 10*3/uL (ref 0.0–0.7)
Hemoglobin: 13.5 g/dL (ref 12.0–15.0)
Lymphocytes Relative: 46 % (ref 12–46)
Lymphs Abs: 2.2 10*3/uL (ref 0.7–4.0)
Monocytes Relative: 10 % (ref 3–12)
Neutro Abs: 1.9 10*3/uL (ref 1.7–7.7)
Neutrophils Relative %: 42 % — ABNORMAL LOW (ref 43–77)
Platelets: 218 10*3/uL (ref 150–400)
RBC: 4.33 MIL/uL (ref 3.87–5.11)
WBC: 4.7 10*3/uL (ref 4.0–10.5)

## 2013-04-04 LAB — COMPREHENSIVE METABOLIC PANEL
ALT: 19 U/L (ref 0–35)
Albumin: 4.2 g/dL (ref 3.5–5.2)
CO2: 30 mEq/L (ref 19–32)
Chloride: 102 mEq/L (ref 96–112)
Glucose, Bld: 89 mg/dL (ref 70–99)
Potassium: 4.2 mEq/L (ref 3.5–5.3)
Sodium: 138 mEq/L (ref 135–145)
Total Bilirubin: 0.3 mg/dL (ref 0.3–1.2)
Total Protein: 6.9 g/dL (ref 6.0–8.3)

## 2013-04-04 LAB — LIPID PANEL
Cholesterol: 146 mg/dL (ref 0–200)
VLDL: 44 mg/dL — ABNORMAL HIGH (ref 0–40)

## 2013-04-05 LAB — VITAMIN D 25 HYDROXY (VIT D DEFICIENCY, FRACTURES): Vit D, 25-Hydroxy: 62 ng/mL (ref 30–89)

## 2013-04-07 ENCOUNTER — Ambulatory Visit (INDEPENDENT_AMBULATORY_CARE_PROVIDER_SITE_OTHER): Payer: Medicare Other | Admitting: Internal Medicine

## 2013-04-07 ENCOUNTER — Encounter: Payer: Self-pay | Admitting: Internal Medicine

## 2013-04-07 VITALS — BP 136/78 | Temp 98.1°F | Ht 64.0 in | Wt 133.0 lb

## 2013-04-07 DIAGNOSIS — Z0000 Encounter for general adult medical examination without abnormal findings: Secondary | ICD-10-CM

## 2013-04-07 DIAGNOSIS — I10 Essential (primary) hypertension: Secondary | ICD-10-CM

## 2013-04-07 DIAGNOSIS — E039 Hypothyroidism, unspecified: Secondary | ICD-10-CM | POA: Diagnosis not present

## 2013-04-07 DIAGNOSIS — J309 Allergic rhinitis, unspecified: Secondary | ICD-10-CM

## 2013-04-07 DIAGNOSIS — F319 Bipolar disorder, unspecified: Secondary | ICD-10-CM

## 2013-04-07 DIAGNOSIS — Z23 Encounter for immunization: Secondary | ICD-10-CM | POA: Diagnosis not present

## 2013-04-07 DIAGNOSIS — K589 Irritable bowel syndrome without diarrhea: Secondary | ICD-10-CM

## 2013-04-07 DIAGNOSIS — E781 Pure hyperglyceridemia: Secondary | ICD-10-CM

## 2013-04-07 DIAGNOSIS — K219 Gastro-esophageal reflux disease without esophagitis: Secondary | ICD-10-CM

## 2013-04-07 LAB — POCT URINALYSIS DIPSTICK
Blood, UA: NEGATIVE
Nitrite, UA: NEGATIVE
Spec Grav, UA: 1.025
Urobilinogen, UA: NEGATIVE
pH, UA: 5.5

## 2013-04-07 NOTE — Progress Notes (Signed)
Subjective:    Patient ID: [name] [name], female    DOB: [birth year], 67 y.o.   MRN: [id number]  HPI  67 year old White female for health maintenance and evaluation of medical problems. Seeing Dr. Althea Charon for plantar fasciitis both feet and treated for about 4 weeks with boot type devices. Hx of bipolar disorder, hypothyroidism and hyperlipidemia. History of irritable bowel syndrome treated by Dr. Jarold Motto. History of GE reflux. History of hypertension and allergic rhinitis.  Social history: Does not smoke or consume alcohol. She is divorced and used to work as a Runner, broadcasting/film/video. Completed 4 years of college. Resides alone.  Family history: One brother with history of hypertension. Mother with history of "nervous breakdown". Father with history of hypertension.  Past medical history: Psychiatrist is Dr. Donell Beers. Has seen by our allergy for allergic rhinitis. Has had positive skin reactions to dust mold and dust mites. Remote history of fractured wrist. History of superficial erosions in the gastric antrum in 1997 as well as the duodenal bulb on endoscopy. CLOtest was negative. Had yellowish plaques in the middle and proximal esophagus. There was no evidence of malignancy. Patient had intestinal metaplasia. Was treated with Prilosec. Was treated for Candida with Diflucan. Stress seems to aggravate irritable bowel symptoms.    Review of Systems  Constitutional: Negative.   HENT: Negative.   Eyes: Negative.   Respiratory: Negative.   Cardiovascular: Negative.   Gastrointestinal:       No GI complaints today. History of paratubal bowel syndrome and GE reflux  Endocrine:       History of hypothyroidism  Genitourinary: Negative.   Allergic/Immunologic: Positive for environmental allergies.  Neurological: Negative.   Hematological: Negative.   Psychiatric/Behavioral:       History of bipolar disorder       Objective:   Physical Exam  Vitals reviewed. Constitutional: She is oriented to person,  place, and time. She appears well-developed and well-nourished. No distress.  HENT:  Head: Normocephalic and atraumatic.  Right Ear: External ear normal.  Left Ear: External ear normal.  Mouth/Throat: Oropharynx is clear and moist. No oropharyngeal exudate.  Eyes: Conjunctivae and EOM are normal. Pupils are equal, round, and reactive to light. Right eye exhibits no discharge. Left eye exhibits no discharge. No scleral icterus.  Neck: Neck supple. No JVD present. No thyromegaly present.  Cardiovascular: Normal rate, regular rhythm, normal heart sounds and intact distal pulses.   No murmur heard. Pulmonary/Chest: Breath sounds normal. No respiratory distress. She has no wheezes. She has no rales. She exhibits no tenderness.  Breasts normal female  Abdominal: Soft. Bowel sounds are normal. She exhibits no distension and no mass. There is no tenderness. There is no rebound and no guarding.  Genitourinary:  Pap done 2013. Bimanual exam is normal.  Musculoskeletal: She exhibits no edema.  Lymphadenopathy:    She has no cervical adenopathy.  Neurological: She is alert and oriented to person, place, and time. She has normal reflexes. She displays normal reflexes. No cranial nerve deficit. Coordination normal.  Skin: Skin is warm and dry. No rash noted. She is not diaphoretic.  Psychiatric: She has a normal mood and affect. Her behavior is normal. Judgment and thought content normal.          Assessment & Plan:  Hypothyroidism-stable on thyroid replacement therapy  History of allergic rhinitis-stable previously seen at lower allergy  Hypertension-stable on losartan  History of bipolar disorder-treated by Dr. Donell Beers  Hypertriglyceridemia-treated with diet and exercise and statin therapy.  Triglycerides are in the low 200 range.  Plan: Return in 6 months for office visit, TSH and fasting lipid panel and liver functions. Order for mammogram and bone density study. Colonoscopy up-to-date.  Declines influenza immunization.    Subjective:   Patient presents for Medicare Annual/Subsequent preventive examination.   Review Past Medical/Family/Social: see above   Risk Factors  Current exercise habits: -walk some Dietary issues discussed: low fat low carb diet  Cardiac risk factors:  Depression Screen  (Note: if answer to either of the following is "Yes", a more complete depression screening is indicated)   Over the past two weeks, have you felt down, depressed or hopeless? No  Over the past two weeks, have you felt little interest or pleasure in doing things? No Have you lost interest or pleasure in daily life? No Do you often feel hopeless? No Do you cry easily over simple problems? No   Activities of Daily Living  In your present state of health, do you have any difficulty performing the following activities?:   Driving? No  Managing money? No  Feeding yourself? No  Getting from bed to chair? No  Climbing a flight of stairs? No  Preparing food and eating?: No  Bathing or showering? No  Getting dressed: No  Getting to the toilet? No  Using the toilet:No  Moving around from place to place: No  In the past year have you fallen or had a near fall?:No  Are you sexually active? No  Do you have more than one partner? No   Hearing Difficulties: No  Do you often ask people to speak up or repeat themselves? No  Do you experience ringing or noises in your ears? No  Do you have difficulty understanding soft or whispered voices? No  Do you feel that you have a problem with memory? No Do you often misplace items? No    Home Safety:  Do you have a smoke alarm at your residence? Yes Do you have grab bars in the bathroom? no Do you have throw rugs in your house? yes   Cognitive Testing  Alert? Yes Normal Appearance?Yes  Oriented to person? Yes Place? Yes  Time? Yes  Recall of three objects? Yes  Can perform simple calculations? Yes  Displays appropriate  judgment?Yes  Can read the correct time from a watch face?Yes   List the Names of Other Physician/Practitioners you currently use:  See referral list for the physicians patient is currently seeing. Dr. Aris Everts; Dr. Ulyses Amor; Dr. Clair Gulling; Hyacinth Meeker Vision-optometrist; Dr. Buena Irish    Review of Systems: see above   Objective:     General appearance: Appears stated age  Head: Normocephalic, without obvious abnormality, atraumatic  Eyes: conj clear, EOMi PEERLA  Ears: normal TM's and external ear canals both ears  Nose: Nares normal. Septum midline. Mucosa normal. No drainage or sinus tenderness.  Throat: lips, mucosa, and tongue normal; teeth and gums normal  Neck: no adenopathy, no carotid bruit, no JVD, supple, symmetrical, trachea midline and thyroid not enlarged, symmetric, no tenderness/mass/nodules  No CVA tenderness.  Lungs: clear to auscultation bilaterally  Breasts: normal appearance, no masses or tenderness. Heart: regular rate and rhythm, S1, S2 normal, no murmur, click, rub or gallop  Abdomen: soft, non-tender; bowel sounds normal; no masses, no organomegaly  Musculoskeletal: ROM normal in all joints, no crepitus, no deformity, Normal muscle strengthen. Back  is symmetric, no curvature. Skin: Skin color, texture, turgor normal. No rashes or lesions  Lymph nodes: Cervical,  supraclavicular, and axillary nodes normal.  Neurologic: CN 2 -12 Normal, Normal symmetric reflexes. Normal coordination and gait  Psych: Alert & Oriented x 3, Mood appear stable.    Assessment:    Annual wellness medicare exam   Plan:    During the course of the visit the patient was educated and counseled about appropriate screening and preventive services including:   Annual mammogram Colonoscopy per Dr. Jarold Motto     Patient Instructions (the written plan) was given to the patient.  Medicare Attestation  I have personally reviewed:  The patient's medical and  social history  Their use of alcohol, tobacco or illicit drugs  Their current medications and supplements  The patient's functional ability including ADLs,fall risks, home safety risks, cognitive, and hearing and visual impairment  Diet and physical activities  Evidence for depression or mood disorders  The patient's weight, height, BMI, and visual acuity have been recorded in the chart. I have made referrals, counseling, and provided education to the patient based on review of the above and I have provided the patient with a written personalized care plan for preventive services.

## 2013-04-09 DIAGNOSIS — E781 Pure hyperglyceridemia: Secondary | ICD-10-CM | POA: Insufficient documentation

## 2013-04-09 NOTE — Patient Instructions (Addendum)
Continue same medications. Watch diet and exercise. Return in 6 months for office visit TSH and fasting lipid panel and liver functions.

## 2013-04-18 DIAGNOSIS — M25579 Pain in unspecified ankle and joints of unspecified foot: Secondary | ICD-10-CM | POA: Diagnosis not present

## 2013-04-18 DIAGNOSIS — M25519 Pain in unspecified shoulder: Secondary | ICD-10-CM | POA: Diagnosis not present

## 2013-04-18 DIAGNOSIS — M722 Plantar fascial fibromatosis: Secondary | ICD-10-CM | POA: Diagnosis not present

## 2013-04-19 DIAGNOSIS — M722 Plantar fascial fibromatosis: Secondary | ICD-10-CM | POA: Diagnosis not present

## 2013-04-21 DIAGNOSIS — M722 Plantar fascial fibromatosis: Secondary | ICD-10-CM | POA: Diagnosis not present

## 2013-04-27 DIAGNOSIS — M25519 Pain in unspecified shoulder: Secondary | ICD-10-CM | POA: Diagnosis not present

## 2013-04-27 DIAGNOSIS — M722 Plantar fascial fibromatosis: Secondary | ICD-10-CM | POA: Diagnosis not present

## 2013-04-29 DIAGNOSIS — M722 Plantar fascial fibromatosis: Secondary | ICD-10-CM | POA: Diagnosis not present

## 2013-05-02 DIAGNOSIS — M722 Plantar fascial fibromatosis: Secondary | ICD-10-CM | POA: Diagnosis not present

## 2013-05-02 DIAGNOSIS — M25519 Pain in unspecified shoulder: Secondary | ICD-10-CM | POA: Diagnosis not present

## 2013-05-03 ENCOUNTER — Other Ambulatory Visit: Payer: Self-pay | Admitting: Internal Medicine

## 2013-05-04 DIAGNOSIS — M722 Plantar fascial fibromatosis: Secondary | ICD-10-CM | POA: Diagnosis not present

## 2013-05-04 DIAGNOSIS — M76899 Other specified enthesopathies of unspecified lower limb, excluding foot: Secondary | ICD-10-CM | POA: Diagnosis not present

## 2013-05-06 ENCOUNTER — Other Ambulatory Visit: Payer: Self-pay | Admitting: Internal Medicine

## 2013-05-09 DIAGNOSIS — M722 Plantar fascial fibromatosis: Secondary | ICD-10-CM | POA: Diagnosis not present

## 2013-05-11 DIAGNOSIS — M722 Plantar fascial fibromatosis: Secondary | ICD-10-CM | POA: Diagnosis not present

## 2013-05-19 DIAGNOSIS — M722 Plantar fascial fibromatosis: Secondary | ICD-10-CM | POA: Diagnosis not present

## 2013-05-30 DIAGNOSIS — M722 Plantar fascial fibromatosis: Secondary | ICD-10-CM | POA: Diagnosis not present

## 2013-05-30 DIAGNOSIS — M76899 Other specified enthesopathies of unspecified lower limb, excluding foot: Secondary | ICD-10-CM | POA: Diagnosis not present

## 2013-06-08 DIAGNOSIS — M76899 Other specified enthesopathies of unspecified lower limb, excluding foot: Secondary | ICD-10-CM | POA: Diagnosis not present

## 2013-06-10 DIAGNOSIS — M722 Plantar fascial fibromatosis: Secondary | ICD-10-CM | POA: Diagnosis not present

## 2013-06-10 DIAGNOSIS — M76899 Other specified enthesopathies of unspecified lower limb, excluding foot: Secondary | ICD-10-CM | POA: Diagnosis not present

## 2013-06-13 DIAGNOSIS — M722 Plantar fascial fibromatosis: Secondary | ICD-10-CM | POA: Diagnosis not present

## 2013-06-13 DIAGNOSIS — M76899 Other specified enthesopathies of unspecified lower limb, excluding foot: Secondary | ICD-10-CM | POA: Diagnosis not present

## 2013-06-15 DIAGNOSIS — M76899 Other specified enthesopathies of unspecified lower limb, excluding foot: Secondary | ICD-10-CM | POA: Diagnosis not present

## 2013-06-15 DIAGNOSIS — M722 Plantar fascial fibromatosis: Secondary | ICD-10-CM | POA: Diagnosis not present

## 2013-06-20 DIAGNOSIS — M722 Plantar fascial fibromatosis: Secondary | ICD-10-CM | POA: Diagnosis not present

## 2013-06-20 DIAGNOSIS — M76899 Other specified enthesopathies of unspecified lower limb, excluding foot: Secondary | ICD-10-CM | POA: Diagnosis not present

## 2013-06-22 DIAGNOSIS — M76899 Other specified enthesopathies of unspecified lower limb, excluding foot: Secondary | ICD-10-CM | POA: Diagnosis not present

## 2013-06-22 DIAGNOSIS — M722 Plantar fascial fibromatosis: Secondary | ICD-10-CM | POA: Diagnosis not present

## 2013-06-27 DIAGNOSIS — M722 Plantar fascial fibromatosis: Secondary | ICD-10-CM | POA: Diagnosis not present

## 2013-06-27 DIAGNOSIS — M25579 Pain in unspecified ankle and joints of unspecified foot: Secondary | ICD-10-CM | POA: Diagnosis not present

## 2013-06-28 DIAGNOSIS — R262 Difficulty in walking, not elsewhere classified: Secondary | ICD-10-CM | POA: Diagnosis not present

## 2013-06-28 DIAGNOSIS — M722 Plantar fascial fibromatosis: Secondary | ICD-10-CM | POA: Diagnosis not present

## 2013-06-28 DIAGNOSIS — M76899 Other specified enthesopathies of unspecified lower limb, excluding foot: Secondary | ICD-10-CM | POA: Diagnosis not present

## 2013-07-01 DIAGNOSIS — F3342 Major depressive disorder, recurrent, in full remission: Secondary | ICD-10-CM | POA: Diagnosis not present

## 2013-08-16 DIAGNOSIS — H2589 Other age-related cataract: Secondary | ICD-10-CM | POA: Diagnosis not present

## 2013-08-16 DIAGNOSIS — H35039 Hypertensive retinopathy, unspecified eye: Secondary | ICD-10-CM | POA: Diagnosis not present

## 2013-08-23 DIAGNOSIS — Z1231 Encounter for screening mammogram for malignant neoplasm of breast: Secondary | ICD-10-CM | POA: Diagnosis not present

## 2013-09-16 DIAGNOSIS — J3089 Other allergic rhinitis: Secondary | ICD-10-CM | POA: Diagnosis not present

## 2013-09-28 DIAGNOSIS — M25569 Pain in unspecified knee: Secondary | ICD-10-CM | POA: Diagnosis not present

## 2013-10-03 DIAGNOSIS — M25569 Pain in unspecified knee: Secondary | ICD-10-CM | POA: Diagnosis not present

## 2013-10-03 DIAGNOSIS — R262 Difficulty in walking, not elsewhere classified: Secondary | ICD-10-CM | POA: Diagnosis not present

## 2013-10-04 ENCOUNTER — Other Ambulatory Visit: Payer: Self-pay | Admitting: Dermatology

## 2013-10-04 DIAGNOSIS — D1801 Hemangioma of skin and subcutaneous tissue: Secondary | ICD-10-CM | POA: Diagnosis not present

## 2013-10-04 DIAGNOSIS — C44519 Basal cell carcinoma of skin of other part of trunk: Secondary | ICD-10-CM | POA: Diagnosis not present

## 2013-10-04 DIAGNOSIS — Z85828 Personal history of other malignant neoplasm of skin: Secondary | ICD-10-CM | POA: Diagnosis not present

## 2013-10-04 DIAGNOSIS — L570 Actinic keratosis: Secondary | ICD-10-CM | POA: Diagnosis not present

## 2013-10-04 DIAGNOSIS — L821 Other seborrheic keratosis: Secondary | ICD-10-CM | POA: Diagnosis not present

## 2013-10-10 DIAGNOSIS — M25569 Pain in unspecified knee: Secondary | ICD-10-CM | POA: Diagnosis not present

## 2013-10-10 DIAGNOSIS — R262 Difficulty in walking, not elsewhere classified: Secondary | ICD-10-CM | POA: Diagnosis not present

## 2013-10-12 DIAGNOSIS — R262 Difficulty in walking, not elsewhere classified: Secondary | ICD-10-CM | POA: Diagnosis not present

## 2013-10-12 DIAGNOSIS — M25569 Pain in unspecified knee: Secondary | ICD-10-CM | POA: Diagnosis not present

## 2013-10-17 DIAGNOSIS — M25569 Pain in unspecified knee: Secondary | ICD-10-CM | POA: Diagnosis not present

## 2013-10-17 DIAGNOSIS — R262 Difficulty in walking, not elsewhere classified: Secondary | ICD-10-CM | POA: Diagnosis not present

## 2013-10-25 ENCOUNTER — Other Ambulatory Visit: Payer: Self-pay | Admitting: Internal Medicine

## 2013-10-25 NOTE — Telephone Encounter (Signed)
Past due for 6 month recheck. Lipid liver, TSH and OV. Had CPE 8/14

## 2013-10-28 DIAGNOSIS — H02839 Dermatochalasis of unspecified eye, unspecified eyelid: Secondary | ICD-10-CM | POA: Diagnosis not present

## 2013-10-28 DIAGNOSIS — H25049 Posterior subcapsular polar age-related cataract, unspecified eye: Secondary | ICD-10-CM | POA: Diagnosis not present

## 2013-10-28 DIAGNOSIS — H2510 Age-related nuclear cataract, unspecified eye: Secondary | ICD-10-CM | POA: Diagnosis not present

## 2013-10-28 DIAGNOSIS — H25019 Cortical age-related cataract, unspecified eye: Secondary | ICD-10-CM | POA: Diagnosis not present

## 2013-10-28 DIAGNOSIS — H18419 Arcus senilis, unspecified eye: Secondary | ICD-10-CM | POA: Diagnosis not present

## 2013-11-01 ENCOUNTER — Other Ambulatory Visit: Payer: Medicare Other | Admitting: Internal Medicine

## 2013-11-01 DIAGNOSIS — E785 Hyperlipidemia, unspecified: Secondary | ICD-10-CM

## 2013-11-01 DIAGNOSIS — Z79899 Other long term (current) drug therapy: Secondary | ICD-10-CM

## 2013-11-01 DIAGNOSIS — E889 Metabolic disorder, unspecified: Secondary | ICD-10-CM

## 2013-11-01 DIAGNOSIS — F3342 Major depressive disorder, recurrent, in full remission: Secondary | ICD-10-CM | POA: Diagnosis not present

## 2013-11-01 LAB — LIPID PANEL
CHOL/HDL RATIO: 3.1 ratio
Cholesterol: 157 mg/dL (ref 0–200)
HDL: 50 mg/dL (ref >39)
LDL Cholesterol: 70 mg/dL (ref 0–99)
Triglycerides: 183 mg/dL — ABNORMAL HIGH (ref <150)
VLDL: 37 mg/dL (ref 0–40)

## 2013-11-01 LAB — HEPATIC FUNCTION PANEL
ALT: 20 U/L (ref 0–35)
AST: 21 U/L (ref 0–37)
Albumin: 4.4 g/dL (ref 3.5–5.2)
Alkaline Phosphatase: 58 U/L (ref 39–117)
BILIRUBIN DIRECT: 0.1 mg/dL (ref 0.0–0.3)
Indirect Bilirubin: 0.3 mg/dL (ref 0.2–1.2)
TOTAL PROTEIN: 6.7 g/dL (ref 6.0–8.3)
Total Bilirubin: 0.4 mg/dL (ref 0.2–1.2)

## 2013-11-01 LAB — TSH: TSH: 1.858 u[IU]/mL (ref 0.350–4.500)

## 2013-11-03 ENCOUNTER — Ambulatory Visit (INDEPENDENT_AMBULATORY_CARE_PROVIDER_SITE_OTHER): Payer: Medicare Other | Admitting: Internal Medicine

## 2013-11-03 ENCOUNTER — Encounter: Payer: Self-pay | Admitting: Internal Medicine

## 2013-11-03 ENCOUNTER — Other Ambulatory Visit: Payer: Self-pay | Admitting: Internal Medicine

## 2013-11-03 VITALS — BP 108/78 | HR 80 | Ht 64.0 in | Wt 132.0 lb

## 2013-11-03 DIAGNOSIS — I10 Essential (primary) hypertension: Secondary | ICD-10-CM

## 2013-11-03 DIAGNOSIS — Z8659 Personal history of other mental and behavioral disorders: Secondary | ICD-10-CM

## 2013-11-03 DIAGNOSIS — E039 Hypothyroidism, unspecified: Secondary | ICD-10-CM | POA: Diagnosis not present

## 2013-11-03 DIAGNOSIS — E785 Hyperlipidemia, unspecified: Secondary | ICD-10-CM

## 2013-11-03 DIAGNOSIS — H269 Unspecified cataract: Secondary | ICD-10-CM

## 2013-11-03 NOTE — Patient Instructions (Signed)
Continue same medications and return in 6 months 

## 2013-11-03 NOTE — Progress Notes (Signed)
   Subjective:    Patient ID: [name] [name], female    DOB: 08-21-1945, 68 y.o.   MRN: [id number]  HPI In today for six-month recheck on hypertension, hypertriglyceridemia, hypothyroidism. She is to have cataract surgery by Dr. Talbert Forest May 4. Needs both cataracts removed. They will be done a month apart. Blood pressure is excellent on current regimen. Lipid panel and liver functions are stable. Triglycerides are actually improved from last visit. TSH within normal limits on Synthroid 0.025 mg daily. Patient is concern she may have wax in her ears.    Review of Systems     Objective:   Physical Exam TMs are clear bilaterally and external canals are free of debris. Neck is supple without JVD thyromegaly or carotid bruits. Chest clear to auscultation. Cardiac exam regular rate and rhythm normal S1 and S2. Extremities without edema       Assessment & Plan:  Hypertension-stable on current regimen  Hyperlipidemia-stable on treatment  Hypothyroidism-TSH within normal limits on thyroid replacement therapy  Bilateral cataracts-to be removed early May and early June  History of bipolar disorder  Plan: Return in 6 months for physical examination.

## 2013-11-09 DIAGNOSIS — H1045 Other chronic allergic conjunctivitis: Secondary | ICD-10-CM | POA: Diagnosis not present

## 2013-11-09 DIAGNOSIS — J3089 Other allergic rhinitis: Secondary | ICD-10-CM | POA: Diagnosis not present

## 2013-12-12 DIAGNOSIS — H2510 Age-related nuclear cataract, unspecified eye: Secondary | ICD-10-CM | POA: Diagnosis not present

## 2013-12-12 DIAGNOSIS — H269 Unspecified cataract: Secondary | ICD-10-CM | POA: Diagnosis not present

## 2013-12-13 DIAGNOSIS — H2510 Age-related nuclear cataract, unspecified eye: Secondary | ICD-10-CM | POA: Diagnosis not present

## 2014-01-02 DIAGNOSIS — H269 Unspecified cataract: Secondary | ICD-10-CM | POA: Diagnosis not present

## 2014-01-02 DIAGNOSIS — H2510 Age-related nuclear cataract, unspecified eye: Secondary | ICD-10-CM | POA: Diagnosis not present

## 2014-01-05 DIAGNOSIS — H2510 Age-related nuclear cataract, unspecified eye: Secondary | ICD-10-CM | POA: Diagnosis not present

## 2014-01-27 DIAGNOSIS — M899 Disorder of bone, unspecified: Secondary | ICD-10-CM | POA: Diagnosis not present

## 2014-01-27 DIAGNOSIS — M949 Disorder of cartilage, unspecified: Secondary | ICD-10-CM | POA: Diagnosis not present

## 2014-02-01 DIAGNOSIS — H16219 Exposure keratoconjunctivitis, unspecified eye: Secondary | ICD-10-CM | POA: Diagnosis not present

## 2014-02-01 DIAGNOSIS — H16109 Unspecified superficial keratitis, unspecified eye: Secondary | ICD-10-CM | POA: Diagnosis not present

## 2014-02-01 DIAGNOSIS — H04129 Dry eye syndrome of unspecified lacrimal gland: Secondary | ICD-10-CM | POA: Diagnosis not present

## 2014-02-02 DIAGNOSIS — S058X9A Other injuries of unspecified eye and orbit, initial encounter: Secondary | ICD-10-CM | POA: Diagnosis not present

## 2014-02-02 DIAGNOSIS — H04129 Dry eye syndrome of unspecified lacrimal gland: Secondary | ICD-10-CM | POA: Diagnosis not present

## 2014-03-13 DIAGNOSIS — D1801 Hemangioma of skin and subcutaneous tissue: Secondary | ICD-10-CM | POA: Diagnosis not present

## 2014-03-13 DIAGNOSIS — L821 Other seborrheic keratosis: Secondary | ICD-10-CM | POA: Diagnosis not present

## 2014-03-13 DIAGNOSIS — Z85828 Personal history of other malignant neoplasm of skin: Secondary | ICD-10-CM | POA: Diagnosis not present

## 2014-03-13 DIAGNOSIS — L578 Other skin changes due to chronic exposure to nonionizing radiation: Secondary | ICD-10-CM | POA: Diagnosis not present

## 2014-03-13 DIAGNOSIS — L739 Follicular disorder, unspecified: Secondary | ICD-10-CM | POA: Diagnosis not present

## 2014-03-13 DIAGNOSIS — L820 Inflamed seborrheic keratosis: Secondary | ICD-10-CM | POA: Diagnosis not present

## 2014-03-13 DIAGNOSIS — D239 Other benign neoplasm of skin, unspecified: Secondary | ICD-10-CM | POA: Diagnosis not present

## 2014-03-30 DIAGNOSIS — M722 Plantar fascial fibromatosis: Secondary | ICD-10-CM | POA: Diagnosis not present

## 2014-03-31 DIAGNOSIS — F3342 Major depressive disorder, recurrent, in full remission: Secondary | ICD-10-CM | POA: Diagnosis not present

## 2014-04-04 DIAGNOSIS — M722 Plantar fascial fibromatosis: Secondary | ICD-10-CM | POA: Diagnosis not present

## 2014-04-10 DIAGNOSIS — M722 Plantar fascial fibromatosis: Secondary | ICD-10-CM | POA: Diagnosis not present

## 2014-04-10 DIAGNOSIS — M25569 Pain in unspecified knee: Secondary | ICD-10-CM | POA: Diagnosis not present

## 2014-04-11 ENCOUNTER — Other Ambulatory Visit: Payer: Self-pay | Admitting: Internal Medicine

## 2014-04-12 DIAGNOSIS — M722 Plantar fascial fibromatosis: Secondary | ICD-10-CM | POA: Diagnosis not present

## 2014-04-13 ENCOUNTER — Other Ambulatory Visit: Payer: Self-pay | Admitting: Internal Medicine

## 2014-04-23 ENCOUNTER — Other Ambulatory Visit: Payer: Self-pay | Admitting: Internal Medicine

## 2014-05-02 ENCOUNTER — Other Ambulatory Visit: Payer: Medicare Other | Admitting: Internal Medicine

## 2014-05-04 ENCOUNTER — Encounter: Payer: Medicare Other | Admitting: Internal Medicine

## 2014-05-16 ENCOUNTER — Other Ambulatory Visit: Payer: Medicare Other | Admitting: Internal Medicine

## 2014-05-16 DIAGNOSIS — E038 Other specified hypothyroidism: Secondary | ICD-10-CM

## 2014-05-16 DIAGNOSIS — Z0000 Encounter for general adult medical examination without abnormal findings: Secondary | ICD-10-CM

## 2014-05-16 DIAGNOSIS — E781 Pure hyperglyceridemia: Secondary | ICD-10-CM

## 2014-05-16 DIAGNOSIS — I10 Essential (primary) hypertension: Secondary | ICD-10-CM | POA: Diagnosis not present

## 2014-05-16 LAB — COMPREHENSIVE METABOLIC PANEL
ALK PHOS: 60 U/L (ref 39–117)
ALT: 23 U/L (ref 0–35)
AST: 25 U/L (ref 0–37)
Albumin: 4.2 g/dL (ref 3.5–5.2)
BILIRUBIN TOTAL: 0.4 mg/dL (ref 0.2–1.2)
BUN: 20 mg/dL (ref 6–23)
CO2: 25 meq/L (ref 19–32)
CREATININE: 1.05 mg/dL (ref 0.50–1.10)
Calcium: 9.4 mg/dL (ref 8.4–10.5)
Chloride: 103 mEq/L (ref 96–112)
Glucose, Bld: 89 mg/dL (ref 70–99)
Potassium: 4.4 mEq/L (ref 3.5–5.3)
Sodium: 138 mEq/L (ref 135–145)
TOTAL PROTEIN: 6.8 g/dL (ref 6.0–8.3)

## 2014-05-16 LAB — CBC WITH DIFFERENTIAL/PLATELET
Basophils Absolute: 0 10*3/uL (ref 0.0–0.1)
Basophils Relative: 0 % (ref 0–1)
EOS ABS: 0.1 10*3/uL (ref 0.0–0.7)
Eosinophils Relative: 2 % (ref 0–5)
HCT: 38.3 % (ref 36.0–46.0)
Hemoglobin: 13 g/dL (ref 12.0–15.0)
LYMPHS ABS: 2 10*3/uL (ref 0.7–4.0)
Lymphocytes Relative: 42 % (ref 12–46)
MCH: 30.2 pg (ref 26.0–34.0)
MCHC: 33.9 g/dL (ref 30.0–36.0)
MCV: 89.1 fL (ref 78.0–100.0)
Monocytes Absolute: 0.4 10*3/uL (ref 0.1–1.0)
Monocytes Relative: 9 % (ref 3–12)
NEUTROS PCT: 47 % (ref 43–77)
Neutro Abs: 2.2 10*3/uL (ref 1.7–7.7)
PLATELETS: 209 10*3/uL (ref 150–400)
RBC: 4.3 MIL/uL (ref 3.87–5.11)
RDW: 13.4 % (ref 11.5–15.5)
WBC: 4.7 10*3/uL (ref 4.0–10.5)

## 2014-05-16 LAB — LIPID PANEL
Cholesterol: 133 mg/dL (ref 0–200)
HDL: 45 mg/dL (ref >39)
LDL CALC: 53 mg/dL (ref 0–99)
Total CHOL/HDL Ratio: 3 Ratio
Triglycerides: 174 mg/dL — ABNORMAL HIGH (ref <150)
VLDL: 35 mg/dL (ref 0–40)

## 2014-05-16 LAB — TSH: TSH: 2.291 u[IU]/mL (ref 0.350–4.500)

## 2014-05-23 ENCOUNTER — Encounter: Payer: Self-pay | Admitting: Internal Medicine

## 2014-05-23 ENCOUNTER — Ambulatory Visit (INDEPENDENT_AMBULATORY_CARE_PROVIDER_SITE_OTHER): Payer: Medicare Other | Admitting: Internal Medicine

## 2014-05-23 VITALS — BP 138/80 | HR 88 | Temp 98.0°F | Ht 64.0 in | Wt 135.0 lb

## 2014-05-23 DIAGNOSIS — E039 Hypothyroidism, unspecified: Secondary | ICD-10-CM

## 2014-05-23 DIAGNOSIS — Z23 Encounter for immunization: Secondary | ICD-10-CM | POA: Diagnosis not present

## 2014-05-23 DIAGNOSIS — I10 Essential (primary) hypertension: Secondary | ICD-10-CM

## 2014-05-23 DIAGNOSIS — E781 Pure hyperglyceridemia: Secondary | ICD-10-CM | POA: Diagnosis not present

## 2014-05-23 DIAGNOSIS — Z0000 Encounter for general adult medical examination without abnormal findings: Secondary | ICD-10-CM

## 2014-05-23 DIAGNOSIS — K219 Gastro-esophageal reflux disease without esophagitis: Secondary | ICD-10-CM

## 2014-05-23 DIAGNOSIS — F3170 Bipolar disorder, currently in remission, most recent episode unspecified: Secondary | ICD-10-CM | POA: Diagnosis not present

## 2014-05-23 DIAGNOSIS — K589 Irritable bowel syndrome without diarrhea: Secondary | ICD-10-CM

## 2014-05-23 LAB — POCT URINALYSIS DIPSTICK
Bilirubin, UA: NEGATIVE
Blood, UA: NEGATIVE
Glucose, UA: NEGATIVE
KETONES UA: NEGATIVE
LEUKOCYTES UA: NEGATIVE
Nitrite, UA: NEGATIVE
Spec Grav, UA: 1.015
Urobilinogen, UA: NEGATIVE
pH, UA: 5

## 2014-05-23 NOTE — Progress Notes (Signed)
Subjective:    Patient ID: [name] [name], female    DOB: 1946/07/29, 68 y.o.   MRN: [id number]  HPI  68 year old White Female for health maintenance and evaluation of medical issues. Patient has a history of hypertension, hyperlipidemia, anxiety, bipolar disorder, GE reflux, hypothyroidism, irritable bowel syndrome, allergic rhinitis.  History of plantar fasciitis of both feet. Has been treated by Dr. Rip Harbour. History of irritable bowel syndrome treated by Dr. Sharlett Iles who is now retired.  Social history: Does not smoke or consume alcohol. She is divorced. Used to work as a Pharmacist, hospital. Completed 4 years of college. Resides alone.  Family history: One brother with history of hypertension. Mother with history of "nervous breakdown". Father with history of hypertension.  Past medical history: Psychiatrist is Dr. Casimiro Needle. Has been seen by [facility] and evaluation showed reactions to dust, mold, dust mites. Remote history of fractured wrist. History of superficial erosions in the gastric antrum in 1997 as well as the duodenal bulb on endoscopy. CLOtest was negative. She also was found to have yellowish plaques in the middle and proximal esophagus at that time. No evidence of malignancy. Patient had intestinal metaplasia and was treated with Prilosec. Was treated for Candida with Diflucan. Patient says stress seems to aggravate irritable bowel symptoms.    Review of Systems  HENT: Negative.   Cardiovascular:       Hypertension  Gastrointestinal:       GERD and irritable bowel syndrome  Endocrine:       Hypothyroidism  Allergic/Immunologic: Positive for environmental allergies.  Psychiatric/Behavioral:       History of bipolar disorder and depression       Objective:   Physical Exam  Vitals reviewed. Constitutional: She is oriented to person, place, and time. She appears well-developed and well-nourished. No distress.  HENT:  Head: Normocephalic and atraumatic.  Right Ear:  External ear normal.  Left Ear: External ear normal.  Mouth/Throat: Oropharynx is clear and moist. No oropharyngeal exudate.  Eyes: Conjunctivae and EOM are normal. Pupils are equal, round, and reactive to light. Right eye exhibits no discharge. Left eye exhibits no discharge. No scleral icterus.  Neck: Neck supple. No JVD present. No thyromegaly present.  Cardiovascular: Normal rate, regular rhythm, normal heart sounds and intact distal pulses.   No murmur heard. Pulmonary/Chest: Effort normal and breath sounds normal. No respiratory distress. She has no wheezes. She has no rales.  Abdominal: Soft. Bowel sounds are normal. She exhibits no distension and no mass. There is no tenderness. There is no rebound and no guarding.  Genitourinary:  Pap done 2013. Bimanual without massus.  Musculoskeletal: Normal range of motion. She exhibits no edema.  Lymphadenopathy:    She has no cervical adenopathy.  Neurological: She is alert and oriented to person, place, and time. She has normal reflexes. She displays normal reflexes. No cranial nerve deficit. Coordination normal.  Skin: Skin is warm and dry. No rash noted. She is not diaphoretic.  Psychiatric: She has a normal mood and affect. Her behavior is normal. Judgment and thought content normal.          Assessment & Plan:  Hypertension-stable on current regimen of losartan  Bipolar disorder with history of depression treated by Dr. Darnelle Spangle  Hypothyroidism-stable on thyroid replacement with TSH within normal limits  Hypertriglyceridemia-triglycerides previously were 219 and are now 183 on statin therapy  GE reflux  Irritable bowel syndrome  Plan: Return in 6 months for office visit and lipid panel.[name]  Subjective:   Patient presents for Medicare Annual/Subsequent preventive examination.  Review Past Medical/Family/Social:   Risk Factors  Current exercise habits:  Dietary issues discussed:   Cardiac risk factors:  Hypertriglyceridemia.  Depression Screen  (Note: if answer to either of the following is "Yes", a more complete depression screening is indicated)   Over the past two weeks, have you felt down, depressed or hopeless? No  Over the past two weeks, have you felt little interest or pleasure in doing things? No Have you lost interest or pleasure in daily life? No Do you often feel hopeless? No Do you cry easily over simple problems? No   Activities of Daily Living  In your present state of health, do you have any difficulty performing the following activities?:   Driving? No  Managing money? No  Feeding yourself? No  Getting from bed to chair? No  Climbing a flight of stairs? No  Preparing food and eating?: No  Bathing or showering? No  Getting dressed: No  Getting to the toilet? No  Using the toilet:No  Moving around from place to place: No  In the past year have you fallen or had a near fall?:No  Are you sexually active? No  Do you have more than one partner? No   Hearing Difficulties: No  Do you often ask people to speak up or repeat themselves? No  Do you experience ringing or noises in your ears? No  Do you have difficulty understanding soft or whispered voices? No  Do you feel that you have a problem with memory? No Do you often misplace items? No    Home Safety:  Do you have a smoke alarm at your residence? Yes Do you have grab bars in the bathroom? No  Do you have throw rugs in your house? Yes   Cognitive Testing  Alert? Yes Normal Appearance?Yes  Oriented to person? Yes Place? Yes  Time? Yes  Recall of three objects? Yes  Can perform simple calculations? Yes  Displays appropriate judgment?Yes  Can read the correct time from a watch face?Yes   List the Names of Other Physician/Practitioners you currently use:  See referral list for the physicians patient is currently seeing.  Dr. Casimiro Needle- psychiatrist   Review of Systems:   Objective:     General  appearance: Appears stated age Head: Normocephalic, without obvious abnormality, atraumatic  Eyes: conj clear, EOMi PEERLA  Ears: normal TM's and external ear canals both ears  Nose: Nares normal. Septum midline. Mucosa normal. No drainage or sinus tenderness.  Throat: lips, mucosa, and tongue normal; teeth and gums normal  Neck: no adenopathy, no carotid bruit, no JVD, supple, symmetrical, trachea midline and thyroid not enlarged, symmetric, no tenderness/mass/nodules  No CVA tenderness.  Lungs: clear to auscultation bilaterally  Breasts: normal appearance, no masses or tenderness Heart: regular rate and rhythm, S1, S2 normal, no murmur, click, rub or gallop  Abdomen: soft, non-tender; bowel sounds normal; no masses, no organomegaly  Musculoskeletal: ROM normal in all joints, no crepitus, no deformity, Normal muscle strengthen. Back  is symmetric, no curvature. Skin: Skin color, texture, turgor normal. No rashes or lesions  Lymph nodes: Cervical, supraclavicular, and axillary nodes normal.  Neurologic: CN 2 -12 Normal, Normal symmetric reflexes. Normal coordination and gait  Psych: Alert & Oriented x 3, Mood appear stable.    Assessment:    Annual wellness medicare exam   Plan:    During the course of the visit the patient was educated and  counseled about appropriate screening and preventive services including:   Annual mammogram     Patient Instructions (the written plan) was given to the patient.  Medicare Attestation  I have personally reviewed:  The patient's medical and social history  Their use of alcohol, tobacco or illicit drugs  Their current medications and supplements  The patient's functional ability including ADLs,fall risks, home safety risks, cognitive, and hearing and visual impairment  Diet and physical activities  Evidence for depression or mood disorders  The patient's weight, height, BMI, and visual acuity have been recorded in the chart. I have made  referrals, counseling, and provided education to the patient based on review of the above and I have provided the patient with a written personalized care plan for preventive services.

## 2014-06-07 ENCOUNTER — Other Ambulatory Visit: Payer: Self-pay | Admitting: Internal Medicine

## 2014-06-20 ENCOUNTER — Encounter: Payer: Self-pay | Admitting: Internal Medicine

## 2014-07-09 NOTE — Patient Instructions (Signed)
Continue same medications and return in 6 months 

## 2014-07-13 ENCOUNTER — Ambulatory Visit: Payer: Medicare Other | Admitting: Internal Medicine

## 2014-07-13 DIAGNOSIS — J3089 Other allergic rhinitis: Secondary | ICD-10-CM | POA: Diagnosis not present

## 2014-07-13 DIAGNOSIS — J019 Acute sinusitis, unspecified: Secondary | ICD-10-CM | POA: Diagnosis not present

## 2014-07-13 DIAGNOSIS — H1045 Other chronic allergic conjunctivitis: Secondary | ICD-10-CM | POA: Diagnosis not present

## 2014-07-17 ENCOUNTER — Telehealth: Payer: Self-pay

## 2014-07-17 NOTE — Telephone Encounter (Signed)
Patient called about her bill from [facility].  She had a physical and would like to know why she was charged.  Advised patient that sometimes medicare does not cover routine test and that she signed an ABN that would make her responsible for any payments.  Will look into this for patient.

## 2014-07-17 NOTE — Telephone Encounter (Signed)
An email has been sent to the [facility] rep.  Waiting on response.  Patient informed.

## 2014-07-23 ENCOUNTER — Other Ambulatory Visit: Payer: Self-pay | Admitting: Internal Medicine

## 2014-08-22 DIAGNOSIS — H43392 Other vitreous opacities, left eye: Secondary | ICD-10-CM | POA: Diagnosis not present

## 2014-08-30 DIAGNOSIS — Z1231 Encounter for screening mammogram for malignant neoplasm of breast: Secondary | ICD-10-CM | POA: Diagnosis not present

## 2014-09-04 ENCOUNTER — Other Ambulatory Visit: Payer: Self-pay | Admitting: Family Medicine

## 2014-09-04 ENCOUNTER — Ambulatory Visit
Admission: RE | Admit: 2014-09-04 | Discharge: 2014-09-04 | Disposition: A | Discharge status: Home / Self Care | Payer: Medicare Other | Source: Ambulatory Visit | Attending: Family Medicine | Admitting: Family Medicine

## 2014-09-04 DIAGNOSIS — S32010A Wedge compression fracture of first lumbar vertebra, initial encounter for closed fracture: Secondary | ICD-10-CM | POA: Diagnosis not present

## 2014-09-04 DIAGNOSIS — S300XXA Contusion of lower back and pelvis, initial encounter: Secondary | ICD-10-CM | POA: Diagnosis not present

## 2014-09-04 DIAGNOSIS — M545 Low back pain: Secondary | ICD-10-CM

## 2014-09-05 ENCOUNTER — Other Ambulatory Visit (HOSPITAL_COMMUNITY): Payer: Self-pay | Admitting: Family Medicine

## 2014-09-05 DIAGNOSIS — M8580 Other specified disorders of bone density and structure, unspecified site: Secondary | ICD-10-CM

## 2014-09-12 DIAGNOSIS — S300XXA Contusion of lower back and pelvis, initial encounter: Secondary | ICD-10-CM | POA: Diagnosis not present

## 2014-10-10 DIAGNOSIS — S300XXD Contusion of lower back and pelvis, subsequent encounter: Secondary | ICD-10-CM | POA: Diagnosis not present

## 2014-10-13 DIAGNOSIS — S32010D Wedge compression fracture of first lumbar vertebra, subsequent encounter for fracture with routine healing: Secondary | ICD-10-CM | POA: Diagnosis not present

## 2014-10-16 DIAGNOSIS — S32010D Wedge compression fracture of first lumbar vertebra, subsequent encounter for fracture with routine healing: Secondary | ICD-10-CM | POA: Diagnosis not present

## 2014-10-18 DIAGNOSIS — S32010D Wedge compression fracture of first lumbar vertebra, subsequent encounter for fracture with routine healing: Secondary | ICD-10-CM | POA: Diagnosis not present

## 2014-10-20 ENCOUNTER — Encounter: Payer: Self-pay | Admitting: Internal Medicine

## 2014-10-23 DIAGNOSIS — S32010D Wedge compression fracture of first lumbar vertebra, subsequent encounter for fracture with routine healing: Secondary | ICD-10-CM | POA: Diagnosis not present

## 2014-10-25 DIAGNOSIS — S32010D Wedge compression fracture of first lumbar vertebra, subsequent encounter for fracture with routine healing: Secondary | ICD-10-CM | POA: Diagnosis not present

## 2014-10-30 DIAGNOSIS — S32010D Wedge compression fracture of first lumbar vertebra, subsequent encounter for fracture with routine healing: Secondary | ICD-10-CM | POA: Diagnosis not present

## 2014-11-01 DIAGNOSIS — S32010D Wedge compression fracture of first lumbar vertebra, subsequent encounter for fracture with routine healing: Secondary | ICD-10-CM | POA: Diagnosis not present

## 2014-11-06 DIAGNOSIS — S32010D Wedge compression fracture of first lumbar vertebra, subsequent encounter for fracture with routine healing: Secondary | ICD-10-CM | POA: Diagnosis not present

## 2014-11-07 ENCOUNTER — Other Ambulatory Visit: Payer: Medicare Other | Admitting: Internal Medicine

## 2014-11-07 ENCOUNTER — Other Ambulatory Visit: Payer: Self-pay | Admitting: Internal Medicine

## 2014-11-07 DIAGNOSIS — E039 Hypothyroidism, unspecified: Secondary | ICD-10-CM | POA: Diagnosis not present

## 2014-11-07 DIAGNOSIS — M8580 Other specified disorders of bone density and structure, unspecified site: Secondary | ICD-10-CM | POA: Diagnosis not present

## 2014-11-07 DIAGNOSIS — E559 Vitamin D deficiency, unspecified: Secondary | ICD-10-CM | POA: Diagnosis not present

## 2014-11-07 LAB — TSH: TSH: 1.74 u[IU]/mL (ref 0.350–4.500)

## 2014-11-08 DIAGNOSIS — S32010D Wedge compression fracture of first lumbar vertebra, subsequent encounter for fracture with routine healing: Secondary | ICD-10-CM | POA: Diagnosis not present

## 2014-11-10 ENCOUNTER — Encounter: Payer: Self-pay | Admitting: Internal Medicine

## 2014-11-10 ENCOUNTER — Ambulatory Visit (INDEPENDENT_AMBULATORY_CARE_PROVIDER_SITE_OTHER): Payer: Medicare Other | Admitting: Internal Medicine

## 2014-11-10 VITALS — BP 132/78 | HR 88 | Temp 98.2°F | Wt 134.0 lb

## 2014-11-10 DIAGNOSIS — S32000D Wedge compression fracture of unspecified lumbar vertebra, subsequent encounter for fracture with routine healing: Secondary | ICD-10-CM | POA: Diagnosis not present

## 2014-11-10 DIAGNOSIS — E039 Hypothyroidism, unspecified: Secondary | ICD-10-CM | POA: Diagnosis not present

## 2014-11-10 DIAGNOSIS — E785 Hyperlipidemia, unspecified: Secondary | ICD-10-CM | POA: Diagnosis not present

## 2014-11-10 DIAGNOSIS — I10 Essential (primary) hypertension: Secondary | ICD-10-CM

## 2014-11-10 DIAGNOSIS — M8580 Other specified disorders of bone density and structure, unspecified site: Secondary | ICD-10-CM
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
## 2014-11-10 NOTE — Patient Instructions (Signed)
Decrease calcium intake to 500 mg daily. Vitamin D level checked. Continue other medications as previously prescribed. Return in October for physical exam.

## 2014-11-11 LAB — VITAMIN D 25 HYDROXY (VIT D DEFICIENCY, FRACTURES): Vit D, 25-Hydroxy: 38 ng/mL (ref 30–100)

## 2014-11-13 ENCOUNTER — Telehealth: Payer: Self-pay | Admitting: *Deleted

## 2014-11-13 NOTE — Telephone Encounter (Signed)
Left message and instructions regarding VitD results.  Suggest patient start Vit D3 1000units daily

## 2014-12-05 DIAGNOSIS — L919 Hypertrophic disorder of the skin, unspecified: Secondary | ICD-10-CM | POA: Diagnosis not present

## 2014-12-05 DIAGNOSIS — Z85828 Personal history of other malignant neoplasm of skin: Secondary | ICD-10-CM | POA: Diagnosis not present

## 2014-12-28 DIAGNOSIS — M7061 Trochanteric bursitis, right hip: Secondary | ICD-10-CM | POA: Diagnosis not present

## 2014-12-28 DIAGNOSIS — M7062 Trochanteric bursitis, left hip: Secondary | ICD-10-CM | POA: Diagnosis not present

## 2014-12-28 DIAGNOSIS — M722 Plantar fascial fibromatosis: Secondary | ICD-10-CM | POA: Diagnosis not present

## 2014-12-28 DIAGNOSIS — M25512 Pain in left shoulder: Secondary | ICD-10-CM | POA: Diagnosis not present

## 2015-01-03 DIAGNOSIS — M25552 Pain in left hip: Secondary | ICD-10-CM | POA: Diagnosis not present

## 2015-01-03 DIAGNOSIS — M7062 Trochanteric bursitis, left hip: Secondary | ICD-10-CM | POA: Diagnosis not present

## 2015-01-03 DIAGNOSIS — M7061 Trochanteric bursitis, right hip: Secondary | ICD-10-CM | POA: Diagnosis not present

## 2015-01-03 DIAGNOSIS — M25551 Pain in right hip: Secondary | ICD-10-CM | POA: Diagnosis not present

## 2015-01-10 DIAGNOSIS — H26493 Other secondary cataract, bilateral: Secondary | ICD-10-CM | POA: Diagnosis not present

## 2015-01-10 DIAGNOSIS — D3132 Benign neoplasm of left choroid: Secondary | ICD-10-CM | POA: Diagnosis not present

## 2015-01-10 DIAGNOSIS — M7062 Trochanteric bursitis, left hip: Secondary | ICD-10-CM | POA: Diagnosis not present

## 2015-01-10 DIAGNOSIS — M7061 Trochanteric bursitis, right hip: Secondary | ICD-10-CM | POA: Diagnosis not present

## 2015-01-10 DIAGNOSIS — Z961 Presence of intraocular lens: Secondary | ICD-10-CM | POA: Diagnosis not present

## 2015-01-10 DIAGNOSIS — M25552 Pain in left hip: Secondary | ICD-10-CM | POA: Diagnosis not present

## 2015-01-10 DIAGNOSIS — H43393 Other vitreous opacities, bilateral: Secondary | ICD-10-CM | POA: Diagnosis not present

## 2015-01-10 DIAGNOSIS — M25551 Pain in right hip: Secondary | ICD-10-CM | POA: Diagnosis not present

## 2015-01-11 DIAGNOSIS — M25552 Pain in left hip: Secondary | ICD-10-CM | POA: Diagnosis not present

## 2015-01-11 DIAGNOSIS — M7061 Trochanteric bursitis, right hip: Secondary | ICD-10-CM | POA: Diagnosis not present

## 2015-01-11 DIAGNOSIS — M7062 Trochanteric bursitis, left hip: Secondary | ICD-10-CM | POA: Diagnosis not present

## 2015-01-11 DIAGNOSIS — M25551 Pain in right hip: Secondary | ICD-10-CM | POA: Diagnosis not present

## 2015-01-15 DIAGNOSIS — M25551 Pain in right hip: Secondary | ICD-10-CM | POA: Diagnosis not present

## 2015-01-15 DIAGNOSIS — M25552 Pain in left hip: Secondary | ICD-10-CM | POA: Diagnosis not present

## 2015-01-15 DIAGNOSIS — M7061 Trochanteric bursitis, right hip: Secondary | ICD-10-CM | POA: Diagnosis not present

## 2015-01-15 DIAGNOSIS — M7062 Trochanteric bursitis, left hip: Secondary | ICD-10-CM | POA: Diagnosis not present

## 2015-01-17 DIAGNOSIS — J3089 Other allergic rhinitis: Secondary | ICD-10-CM | POA: Diagnosis not present

## 2015-01-17 DIAGNOSIS — H1045 Other chronic allergic conjunctivitis: Secondary | ICD-10-CM | POA: Diagnosis not present

## 2015-02-27 ENCOUNTER — Encounter: Payer: Self-pay | Admitting: Internal Medicine

## 2015-02-27 ENCOUNTER — Ambulatory Visit (INDEPENDENT_AMBULATORY_CARE_PROVIDER_SITE_OTHER): Payer: Medicare Other | Admitting: Internal Medicine

## 2015-02-27 VITALS — BP 100/60 | HR 72 | Ht 63.0 in | Wt 134.5 lb

## 2015-02-27 DIAGNOSIS — K589 Irritable bowel syndrome without diarrhea: Secondary | ICD-10-CM | POA: Diagnosis not present

## 2015-02-27 DIAGNOSIS — F4310 Post-traumatic stress disorder, unspecified: Secondary | ICD-10-CM | POA: Insufficient documentation
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
## 2015-02-27 NOTE — Patient Instructions (Addendum)
  Today you have been given a handout to read on Low Fiber diet information.   Please purchase and take VSL #3 daily, coupon provided.   Taper off your Biscodyl gradually over the next several days decrease your dose.   Follow up with Dr Carlean Purl in 2 months.    I appreciate the opportunity to care for you. Silvano Rusk, MD, [facility]
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
## 2015-02-28 ENCOUNTER — Telehealth: Payer: Self-pay | Admitting: Internal Medicine

## 2015-02-28 NOTE — Telephone Encounter (Signed)
Patient advised per office note patient to take 2 VSL #3 a day

## 2015-03-08 ENCOUNTER — Telehealth: Payer: Self-pay | Admitting: Internal Medicine

## 2015-03-08 NOTE — Telephone Encounter (Signed)
Patient notified to try decreasing VSL #3 to one time a day.  She is to call back if constipation does not improve with change in VSL dose.  She agrees with this plan

## 2015-04-17 ENCOUNTER — Other Ambulatory Visit (HOSPITAL_COMMUNITY): Payer: Self-pay | Admitting: Psychiatry

## 2015-04-24 ENCOUNTER — Other Ambulatory Visit: Payer: Self-pay | Admitting: Internal Medicine

## 2015-04-30 DIAGNOSIS — D1801 Hemangioma of skin and subcutaneous tissue: Secondary | ICD-10-CM | POA: Diagnosis not present

## 2015-04-30 DIAGNOSIS — D225 Melanocytic nevi of trunk: Secondary | ICD-10-CM | POA: Diagnosis not present

## 2015-04-30 DIAGNOSIS — L738 Other specified follicular disorders: Secondary | ICD-10-CM | POA: Diagnosis not present

## 2015-04-30 DIAGNOSIS — Z85828 Personal history of other malignant neoplasm of skin: Secondary | ICD-10-CM | POA: Diagnosis not present

## 2015-04-30 DIAGNOSIS — C44319 Basal cell carcinoma of skin of other parts of face: Secondary | ICD-10-CM | POA: Diagnosis not present

## 2015-04-30 DIAGNOSIS — L570 Actinic keratosis: Secondary | ICD-10-CM | POA: Diagnosis not present

## 2015-04-30 DIAGNOSIS — L820 Inflamed seborrheic keratosis: Secondary | ICD-10-CM | POA: Diagnosis not present

## 2015-05-01 ENCOUNTER — Ambulatory Visit (INDEPENDENT_AMBULATORY_CARE_PROVIDER_SITE_OTHER): Payer: Medicare Other | Admitting: Internal Medicine

## 2015-05-01 ENCOUNTER — Encounter: Payer: Self-pay | Admitting: Internal Medicine

## 2015-05-01 VITALS — BP 118/62 | HR 84 | Ht 64.0 in | Wt 134.4 lb

## 2015-05-01 DIAGNOSIS — K589 Irritable bowel syndrome without diarrhea: Secondary | ICD-10-CM

## 2015-05-01 NOTE — Patient Instructions (Signed)
   Follow up with Dr Gessner as needed.   I appreciate the opportunity to care for you. Carl Gessner, MD, FACG 

## 2015-05-02 ENCOUNTER — Encounter: Payer: Self-pay | Admitting: Internal Medicine

## 2015-05-02 NOTE — Progress Notes (Signed)
   Subjective:    Patient ID: [name] [name], female    DOB: October 14, 1945, 69 y.o.   MRN: [id number] Cc: f/u IBS, diarrhea HPI Seen 2 months ago w/ urgent unpredictable diarrhea in setting of IBS dx Stopped bisacodyl and started VSL # 3 "I am doing great - the best I have been in 20 years"  Having predictable formed stools.  Medications, allergies, past medical history, past surgical history, family history and social history are reviewed and updated in the EMR. Review of Systems As above    Objective:   Physical Exam BP 118/62 mmHg  Pulse 84  Ht 5\' 4"  (1.626 m)  Wt 134 lb 6 oz (60.952 kg)  BMI 23.05 kg/m2 NAD     Assessment & Plan:  IBS (irritable bowel syndrome) Doing very well off bisacodyl and on VSL #3 She willc ontinue - advised she can reduce VSL # 3 to qod (taking qd now) but ok to stay qd See me prn

## 2015-05-02 NOTE — Assessment & Plan Note (Signed)
Doing very well off bisacodyl and on VSL #3 She willc ontinue - advised she can reduce VSL # 3 to qod (taking qd now) but ok to stay qd See me prn

## 2015-05-28 ENCOUNTER — Other Ambulatory Visit: Payer: Medicare Other | Admitting: Internal Medicine

## 2015-05-28 DIAGNOSIS — Z0000 Encounter for general adult medical examination without abnormal findings: Secondary | ICD-10-CM | POA: Diagnosis not present

## 2015-05-28 DIAGNOSIS — Z130 Encounter for screening for diseases of the blood and blood-forming organs and certain disorders involving the immune mechanism: Secondary | ICD-10-CM

## 2015-05-28 DIAGNOSIS — Z1329 Encounter for screening for other suspected endocrine disorder: Secondary | ICD-10-CM

## 2015-05-28 DIAGNOSIS — E785 Hyperlipidemia, unspecified: Secondary | ICD-10-CM | POA: Diagnosis not present

## 2015-05-28 DIAGNOSIS — I10 Essential (primary) hypertension: Secondary | ICD-10-CM | POA: Diagnosis not present

## 2015-05-28 DIAGNOSIS — E038 Other specified hypothyroidism: Secondary | ICD-10-CM

## 2015-05-28 DIAGNOSIS — Z13220 Encounter for screening for lipoid disorders: Secondary | ICD-10-CM

## 2015-05-28 LAB — COMPLETE METABOLIC PANEL WITH GFR
ALBUMIN: 4.1 g/dL (ref 3.6–5.1)
ALT: 18 U/L (ref 6–29)
AST: 21 U/L (ref 10–35)
Alkaline Phosphatase: 58 U/L (ref 33–130)
BUN: 18 mg/dL (ref 7–25)
CALCIUM: 9.6 mg/dL (ref 8.6–10.4)
CHLORIDE: 104 mmol/L (ref 98–110)
CO2: 28 mmol/L (ref 20–31)
Creat: 1.07 mg/dL — ABNORMAL HIGH (ref 0.50–0.99)
GFR, Est African American: 61 mL/min (ref >=60)
GFR, Est Non African American: 53 mL/min — ABNORMAL LOW (ref >=60)
Glucose, Bld: 91 mg/dL (ref 65–99)
POTASSIUM: 4.4 mmol/L (ref 3.5–5.3)
Sodium: 141 mmol/L (ref 135–146)
Total Bilirubin: 0.4 mg/dL (ref 0.2–1.2)
Total Protein: 6.9 g/dL (ref 6.1–8.1)

## 2015-05-28 LAB — CBC WITH DIFFERENTIAL/PLATELET
BASOS PCT: 1 % (ref 0–1)
Basophils Absolute: 0 10*3/uL (ref 0.0–0.1)
Eosinophils Absolute: 0.1 10*3/uL (ref 0.0–0.7)
Eosinophils Relative: 3 % (ref 0–5)
HEMATOCRIT: 39.6 % (ref 36.0–46.0)
Hemoglobin: 13.8 g/dL (ref 12.0–15.0)
LYMPHS PCT: 39 % (ref 12–46)
Lymphs Abs: 1.8 10*3/uL (ref 0.7–4.0)
MCH: 31.7 pg (ref 26.0–34.0)
MCHC: 34.8 g/dL (ref 30.0–36.0)
MCV: 90.8 fL (ref 78.0–100.0)
MONO ABS: 0.5 10*3/uL (ref 0.1–1.0)
MPV: 8.9 fL (ref 8.6–12.4)
Monocytes Relative: 10 % (ref 3–12)
NEUTROS ABS: 2.2 10*3/uL (ref 1.7–7.7)
Neutrophils Relative %: 47 % (ref 43–77)
Platelets: 195 10*3/uL (ref 150–400)
RBC: 4.36 MIL/uL (ref 3.87–5.11)
RDW: 13.2 % (ref 11.5–15.5)
WBC: 4.6 10*3/uL (ref 4.0–10.5)

## 2015-05-28 LAB — LIPID PANEL
CHOL/HDL RATIO: 3.2 ratio (ref <=5.0)
Cholesterol: 146 mg/dL (ref 125–200)
HDL: 45 mg/dL — ABNORMAL LOW (ref >=46)
LDL Cholesterol: 65 mg/dL (ref <130)
Triglycerides: 178 mg/dL — ABNORMAL HIGH (ref <150)
VLDL: 36 mg/dL — AB (ref <30)

## 2015-05-28 LAB — TSH: TSH: 1.965 u[IU]/mL (ref 0.350–4.500)

## 2015-06-01 ENCOUNTER — Encounter: Payer: Medicare Other | Admitting: Internal Medicine

## 2015-06-02 ENCOUNTER — Other Ambulatory Visit: Payer: Self-pay | Admitting: Internal Medicine

## 2015-06-05 DIAGNOSIS — Z85828 Personal history of other malignant neoplasm of skin: Secondary | ICD-10-CM | POA: Diagnosis not present

## 2015-06-05 DIAGNOSIS — C4401 Basal cell carcinoma of skin of lip: Secondary | ICD-10-CM | POA: Diagnosis not present

## 2015-06-19 ENCOUNTER — Encounter: Payer: Self-pay | Admitting: Internal Medicine

## 2015-06-19 ENCOUNTER — Ambulatory Visit (INDEPENDENT_AMBULATORY_CARE_PROVIDER_SITE_OTHER): Payer: Medicare Other | Admitting: Internal Medicine

## 2015-06-19 VITALS — BP 114/86 | HR 94 | Temp 98.9°F | Resp 18 | Ht 64.0 in | Wt 136.0 lb

## 2015-06-19 DIAGNOSIS — Z0000 Encounter for general adult medical examination without abnormal findings: Secondary | ICD-10-CM

## 2015-06-19 DIAGNOSIS — I10 Essential (primary) hypertension: Secondary | ICD-10-CM | POA: Diagnosis not present

## 2015-06-19 DIAGNOSIS — J069 Acute upper respiratory infection, unspecified: Secondary | ICD-10-CM

## 2015-06-19 DIAGNOSIS — K219 Gastro-esophageal reflux disease without esophagitis: Secondary | ICD-10-CM | POA: Diagnosis not present

## 2015-06-19 DIAGNOSIS — Z23 Encounter for immunization: Secondary | ICD-10-CM

## 2015-06-19 DIAGNOSIS — E781 Pure hyperglyceridemia: Secondary | ICD-10-CM | POA: Diagnosis not present

## 2015-06-19 DIAGNOSIS — F3170 Bipolar disorder, currently in remission, most recent episode unspecified: Secondary | ICD-10-CM

## 2015-06-19 DIAGNOSIS — E039 Hypothyroidism, unspecified: Secondary | ICD-10-CM | POA: Diagnosis not present

## 2015-06-19 DIAGNOSIS — K589 Irritable bowel syndrome without diarrhea: Secondary | ICD-10-CM | POA: Diagnosis not present

## 2015-06-19 LAB — POCT URINALYSIS DIPSTICK
Bilirubin, UA: NEGATIVE
Glucose, UA: NEGATIVE
Ketones, UA: NEGATIVE
Leukocytes, UA: NEGATIVE
Nitrite, UA: NEGATIVE
PH UA: 6
PROTEIN UA: NEGATIVE
RBC UA: NEGATIVE
SPEC GRAV UA: 1.02
UROBILINOGEN UA: 0.2

## 2015-06-20 ENCOUNTER — Other Ambulatory Visit: Payer: Self-pay | Admitting: Internal Medicine

## 2015-06-29 ENCOUNTER — Ambulatory Visit (INDEPENDENT_AMBULATORY_CARE_PROVIDER_SITE_OTHER): Payer: Medicare Other | Admitting: Internal Medicine

## 2015-06-29 ENCOUNTER — Encounter: Payer: Self-pay | Admitting: Internal Medicine

## 2015-06-29 VITALS — BP 134/78 | HR 97 | Temp 98.5°F | Resp 20 | Ht 64.0 in | Wt 133.0 lb

## 2015-06-29 DIAGNOSIS — J069 Acute upper respiratory infection, unspecified: Secondary | ICD-10-CM

## 2015-06-29 DIAGNOSIS — R490 Dysphonia: Secondary | ICD-10-CM | POA: Diagnosis not present

## 2015-06-29 MED ORDER — BENZONATATE 200 MG PO CAPS: 200.0000 mg | ORAL_CAPSULE | Freq: Three times a day (TID) | ORAL | Status: DC | PRN

## 2015-06-29 MED ORDER — FLUCONAZOLE 150 MG PO TABS: 150.0000 mg | ORAL_TABLET | Freq: Once | ORAL | Status: DC

## 2015-06-29 MED ORDER — AZITHROMYCIN 250 MG PO TABS: ORAL_TABLET | ORAL | Status: DC

## 2015-06-29 NOTE — Patient Instructions (Signed)
Take Zithromax Z-PAK as prescribed. If not better in one week have prescription refilled. Diflucan ordered for possible yeast infection. Take Tessalon Perles for cough.

## 2015-06-29 NOTE — Progress Notes (Signed)
   Subjective:    Patient ID: [name] [name], female    DOB: [birth year], 69 y.o.   MRN: [id number]  HPI Earlier this week developed some hoarseness and raspy voice. Now has a bit of a congested cough. Slight sore throat. Has malaise and fatigue. She is worried because she is singing in choir for the holidays. Low-grade fever up to 100 yesterday. No shaking chills.    Review of Systems     Objective:   Physical Exam  Sounds hoarse when she speaks. Pharynx slightly injected. TMs are clear. Neck is supple without adenopathy. Chest clear to auscultation without rales or wheezing.      Assessment & Plan:  Acute URI  Plan: Zithromax take 2 tablets by mouth day one followed by 1 tablet by mouth days 2 through 5 with 1 refill. If not better in one week, have prescription refill. Tessalon Perles 200 mg 3 times daily as needed for cough. Diflucan if needed for yeast infection. Rest and drink plenty of fluids. Offered to give her injection of Depo-Medrol but she declined. Says steroids keep her up at night.

## 2015-07-03 ENCOUNTER — Encounter: Payer: Self-pay | Admitting: Internal Medicine

## 2015-07-03 ENCOUNTER — Ambulatory Visit (INDEPENDENT_AMBULATORY_CARE_PROVIDER_SITE_OTHER): Payer: Medicare Other | Admitting: Internal Medicine

## 2015-07-03 VITALS — BP 116/80

## 2015-07-03 DIAGNOSIS — J069 Acute upper respiratory infection, unspecified: Secondary | ICD-10-CM | POA: Diagnosis not present

## 2015-07-03 MED ORDER — METHYLPREDNISOLONE ACETATE 80 MG/ML IJ SUSP
80.0000 mg | Freq: Once | INTRAMUSCULAR | Status: AC
  Administered 2015-07-03: 80 mg via INTRAMUSCULAR

## 2015-07-03 NOTE — Progress Notes (Signed)
   Subjective:    Patient ID: [name] [name], female    DOB: [birth year], 69 y.o.   MRN: [id number]  HPI Patient called to say she had changed her mind and wanted injection of Depo-Medrol for persistent hoarseness and congestion. Not seen by M.D. Seen by nurse.    Review of Systems     Objective:   Physical Exam  Not examined      Assessment & Plan:  Acute URI  Hoarseness  Plan: Depo-Medrol 80 mg IM given in office today without complications

## 2015-07-03 NOTE — Patient Instructions (Signed)
Depo-Medrol 80 mg IM given today for persistent hoarseness and congestion

## 2015-07-07 ENCOUNTER — Other Ambulatory Visit: Payer: Self-pay | Admitting: Internal Medicine

## 2015-07-11 ENCOUNTER — Encounter: Payer: Self-pay | Admitting: Internal Medicine

## 2015-07-11 NOTE — Patient Instructions (Addendum)
Prevnar and flu vaccines given. Continue same medications and return in 6 months. Zithromax Z-PAK for acute URI. Watch diet and exercise.

## 2015-07-11 NOTE — Progress Notes (Signed)
Subjective:    Patient ID: [name] [name], female    DOB: March 06, 1946, 69 y.o.   MRN: [id number]  HPI 69 year old Female in today for health maintenance exam and evaluation of multiple medical issues. Has come down with hoarseness and URI symptoms. She is afebrile. She has a history of bipolar affective disorder in remission. History of hypertension and hypothyroidism. History of hypertriglyceridemia, irritable bowel syndrome, GE reflux. History of allergic rhinitis.  History of plantar fasciitis of both feet. Has been treated by Dr. Rip Harbour. History of irritable bowel syndrome treated by Dr. Sharlett Iles who has now retired.  Social history: Does not smoke or consume alcohol. She is divorced. Used to work as a Pharmacist, hospital. Completed 4 years of college. Resides alone. Sings in choir at [employer].  Family history: One brother with hypertension. Mother with history of "nervous breakdown". Father with history of hypertension.  Past medical history: Psychiatrist is Dr. Casimiro Needle. Has been seen [place] [facility] and evaluation showed reactions to dust,, mold, dust mites. Remote history of fractured wrist. History of superficial erosions in the gastric antrum in 1997 as well as the duodenal bulb on endoscopy. CLOtest was negative. She was found have yellowish plaques in the middle and proximal esophagus at that time. No evidence of malignancy. Patient had intestinal metaplasia and was treated with Prilosec. She was also treated for Candida with Diflucan. Patient says stress seems to aggravate irritable bowel symptoms.    Review of Systems hoarseness and URI symptoms.     Objective:   Physical Exam  Constitutional: She is oriented to person, place, and time. She appears well-developed and well-nourished. No distress.  HENT:  Head: Normocephalic and atraumatic.  Right Ear: External ear normal.  Left Ear: External ear normal.  Mouth/Throat: Oropharynx is clear and moist. No oropharyngeal  exudate.  Hoarse when she speaks  Eyes: Conjunctivae and EOM are normal. Pupils are equal, round, and reactive to light. Right eye exhibits no discharge. Left eye exhibits no discharge. No scleral icterus.  Neck: Neck supple. No JVD present. No thyromegaly present.  Cardiovascular: Normal rate, regular rhythm, normal heart sounds and intact distal pulses.   No murmur heard. Pulmonary/Chest: Effort normal and breath sounds normal. She has no wheezes. She has no rales.  Breasts normal female  Abdominal: Soft. Bowel sounds are normal. She exhibits no distension and no mass. There is no tenderness. There is no rebound and no guarding.  Genitourinary:  Pap deferred due to age. Bimanual normal.  Musculoskeletal: Normal range of motion. She exhibits no edema.  Neurological: She is alert and oriented to person, place, and time. She has normal reflexes. No cranial nerve deficit. Coordination normal.  Skin: Skin is warm and dry. No rash noted. She is not diaphoretic.  Psychiatric: She has a normal mood and affect. Her behavior is normal. Judgment and thought content normal.  Vitals reviewed.         Assessment & Plan:  Acute URI  History of bipolar disorder in remission  Hypothyroidism-TSH within normal limits  Hypertriglyceridemia-triglycerides elevated at 178. Continue medication diet and exercise.  GE reflux  Irritable bowel syndrome  Essential hypertension-stable medication  Plan: Return in 6 months or as needed. Treat URI with Zithromax Z-PAK. She is concerned about her performance in choir. Does not want to be on steroids orally for congestion. We discussed IM Depo-Medrol. She will let me know if she decides to take that injection but does not want to do so today.  Subjective:  Patient presents for Medicare Annual/Subsequent preventive examination.  Review Past Medical/Family/Social: See above  Risk Factors  Current exercise habits: Some light exercise Dietary issues  discussed: Low fat low carb  Cardiac risk factors: Hyperlipidemia  Depression Screen  (Note: if answer to either of the following is "Yes", a more complete depression screening is indicated)   Over the past two weeks, have you felt down, depressed or hopeless? No  Over the past two weeks, have you felt little interest or pleasure in doing things? No Have you lost interest or pleasure in daily life? No Do you often feel hopeless? No Do you cry easily over simple problems? No   Activities of Daily Living  In your present state of health, do you have any difficulty performing the following activities?:   Driving? No  Managing money? No  Feeding yourself? No  Getting from bed to chair? No  Climbing a flight of stairs? No  Preparing food and eating?: No  Bathing or showering? No  Getting dressed: No  Getting to the toilet? No  Using the toilet:No  Moving around from place to place: No  In the past year have you fallen or had a near fall?:No  Are you sexually active? No  Do you have more than one partner? No   Hearing Difficulties: No  Do you often ask people to speak up or repeat themselves? No  Do you experience ringing or noises in your ears? No  Do you have difficulty understanding soft or whispered voices? No  Do you feel that you have a problem with memory? No Do you often misplace items? No    Home Safety:  Do you have a smoke alarm at your residence? Yes Do you have grab bars in the bathroom? No Do you have throw rugs in your house? No   Cognitive Testing  Alert? Yes Normal Appearance?Yes  Oriented to person? Yes Place? Yes  Time? Yes  Recall of three objects? Yes  Can perform simple calculations? Yes  Displays appropriate judgment?Yes  Can read the correct time from a watch face?Yes   List the Names of Other Physician/Practitioners you currently use:  See referral list for the physicians patient is currently seeing.     Review of Systems: See  above   Objective:     General appearance: Appears stated age  Head: Normocephalic, without obvious abnormality, atraumatic  Eyes: conj clear, EOMi PEERLA  Ears: normal TM's and external ear canals both ears  Nose: Nares normal. Septum midline. Mucosa normal. No drainage or sinus tenderness.  Throat: lips, mucosa, and tongue normal; teeth and gums normal  Neck: no adenopathy, no carotid bruit, no JVD, supple, symmetrical, trachea midline and thyroid not enlarged, symmetric, no tenderness/mass/nodules  No CVA tenderness.  Lungs: clear to auscultation bilaterally  Breasts: normal appearance, no masses or tenderness Heart: regular rate and rhythm, S1, S2 normal, no murmur, click, rub or gallop  Abdomen: soft, non-tender; bowel sounds normal; no masses, no organomegaly  Musculoskeletal: ROM normal in all joints, no crepitus, no deformity, Normal muscle strengthen. Back  is symmetric, no curvature. Skin: Skin color, texture, turgor normal. No rashes or lesions  Lymph nodes: Cervical, supraclavicular, and axillary nodes normal.  Neurologic: CN 2 -12 Normal, Normal symmetric reflexes. Normal coordination and gait  Psych: Alert & Oriented x 3, Mood appear stable.    Assessment:    Annual wellness medicare exam   Plan:    During the course of the visit the patient  was educated and counseled about appropriate screening and preventive services including:  Annual mammogram  Prevnar  Flu vaccine      Patient Instructions (the written plan) was given to the patient.  Medicare Attestation  I have personally reviewed:  The patient's medical and social history  Their use of alcohol, tobacco or illicit drugs  Their current medications and supplements  The patient's functional ability including ADLs,fall risks, home safety risks, cognitive, and hearing and visual impairment  Diet and physical activities  Evidence for depression or mood disorders  The patient's weight, height, BMI, and  visual acuity have been recorded in the chart. I have made referrals, counseling, and provided education to the patient based on review of the above and I have provided the patient with a written personalized care plan for preventive services.

## 2015-07-25 DIAGNOSIS — Z85828 Personal history of other malignant neoplasm of skin: Secondary | ICD-10-CM | POA: Diagnosis not present

## 2015-07-25 DIAGNOSIS — L570 Actinic keratosis: Secondary | ICD-10-CM | POA: Diagnosis not present

## 2015-07-25 DIAGNOSIS — L821 Other seborrheic keratosis: Secondary | ICD-10-CM | POA: Diagnosis not present

## 2015-08-01 ENCOUNTER — Other Ambulatory Visit: Payer: Self-pay | Admitting: Internal Medicine

## 2015-09-05 DIAGNOSIS — M65311 Trigger thumb, right thumb: Secondary | ICD-10-CM | POA: Diagnosis not present

## 2015-09-19 DIAGNOSIS — Z1231 Encounter for screening mammogram for malignant neoplasm of breast: Secondary | ICD-10-CM | POA: Diagnosis not present

## 2015-09-19 LAB — HM MAMMOGRAPHY: HM Mammogram: NORMAL (ref 0–4)

## 2015-10-19 ENCOUNTER — Encounter: Payer: Self-pay | Admitting: *Deleted

## 2015-12-05 DIAGNOSIS — M65311 Trigger thumb, right thumb: Secondary | ICD-10-CM | POA: Diagnosis not present

## 2015-12-05 DIAGNOSIS — M79672 Pain in left foot: Secondary | ICD-10-CM | POA: Diagnosis not present

## 2015-12-17 ENCOUNTER — Telehealth: Payer: Self-pay | Admitting: Internal Medicine

## 2015-12-17 NOTE — Telephone Encounter (Signed)
Patient instructed to maintain an anti-reflux diet. Advised to avoid caffeine, mint, citrus foods/juices, tomatoes,  chocolate, NSAIDS/ASA products.  Instructed not to eat within 2 hours of exercise or bed, multiple small meals are better than 3 large meals.  Need to take PPI 30 minutes prior to 1st meal of the day. She will purchase OTC Prilosec.  She will try BID for two weeks then decrease to daily.

## 2016-01-11 DIAGNOSIS — I10 Essential (primary) hypertension: Secondary | ICD-10-CM | POA: Diagnosis not present

## 2016-01-11 DIAGNOSIS — H5203 Hypermetropia, bilateral: Secondary | ICD-10-CM | POA: Diagnosis not present

## 2016-01-11 DIAGNOSIS — Z961 Presence of intraocular lens: Secondary | ICD-10-CM | POA: Diagnosis not present

## 2016-01-11 DIAGNOSIS — H35033 Hypertensive retinopathy, bilateral: Secondary | ICD-10-CM | POA: Diagnosis not present

## 2016-01-11 DIAGNOSIS — H26492 Other secondary cataract, left eye: Secondary | ICD-10-CM | POA: Diagnosis not present

## 2016-01-11 DIAGNOSIS — H52223 Regular astigmatism, bilateral: Secondary | ICD-10-CM | POA: Diagnosis not present

## 2016-01-17 DIAGNOSIS — K219 Gastro-esophageal reflux disease without esophagitis: Secondary | ICD-10-CM | POA: Diagnosis not present

## 2016-01-17 DIAGNOSIS — H1045 Other chronic allergic conjunctivitis: Secondary | ICD-10-CM | POA: Diagnosis not present

## 2016-01-17 DIAGNOSIS — J3089 Other allergic rhinitis: Secondary | ICD-10-CM | POA: Diagnosis not present

## 2016-01-18 ENCOUNTER — Other Ambulatory Visit: Payer: Self-pay | Admitting: Internal Medicine

## 2016-01-18 NOTE — Telephone Encounter (Signed)
Has appt for CPE in November. Refill x 6 months

## 2016-02-15 ENCOUNTER — Ambulatory Visit (INDEPENDENT_AMBULATORY_CARE_PROVIDER_SITE_OTHER): Payer: Medicare Other | Admitting: Internal Medicine

## 2016-02-15 ENCOUNTER — Encounter: Payer: Self-pay | Admitting: Internal Medicine

## 2016-02-15 VITALS — BP 140/70 | HR 92 | Ht 63.0 in | Wt 133.0 lb

## 2016-02-15 DIAGNOSIS — K219 Gastro-esophageal reflux disease without esophagitis: Secondary | ICD-10-CM

## 2016-02-15 MED ORDER — ESOMEPRAZOLE MAGNESIUM 20 MG PO CPDR: 20.0000 mg | DELAYED_RELEASE_CAPSULE | Freq: Every day | ORAL | Status: DC

## 2016-02-15 NOTE — Patient Instructions (Signed)
  We have sent the following medications to your pharmacy for you to pick up at your convenience: Generic Nexium, please take this daily for 2 months and then stop it.   Today we are giving you a GERD diet handout to read and follow.     I appreciate the opportunity to care for you. Silvano Rusk, MD, [facility]

## 2016-02-15 NOTE — Progress Notes (Signed)
   Subjective:    Patient ID: [name] [name], female    DOB: October 13, 1945, 70 y.o.   MRN: [id number] Cc: reflux HPI  Had done well x years w/o reflux sxss - in past few mos started w/ heartburn, indigestion. Called [place] and started OTC PPI - better. Tried to stop and sxs returned. Also w/ some hoarseness - saw allergist - raised ? If from GERD No wgt loss, dysphagia, bleeding 2 diet cokes + half-cup coffee a day Has some sore throat/pressure in neck when lies down  Medications, allergies, past medical history, past surgical history, family history and social history are reviewed and updated in the EMR.  Review of Systems As above    Objective:   Physical Exam BP 140/70 mmHg  Pulse 92  Ht 5\' 3"  (1.6 m)  Wt 133 lb (60.328 kg)  BMI 23.57 kg/m2 Neck supple, no mass and nontender Appropriate mood/affect  Wt Readings from Last 3 Encounters:  02/15/16 133 lb (60.328 kg)  06/29/15 133 lb (60.328 kg)  06/19/15 136 lb (61.689 kg)         Assessment & Plan:   Encounter Diagnosis  Name Primary?  . Gastroesophageal reflux disease, esophagitis presence not specified Yes    Esomeprazole 20 mg qd x 2 more mos and try to stop GERD diet/precautions - handout provided and discussed  15 minutes time spent with patient > half in counseling coordination of care

## 2016-02-18 ENCOUNTER — Other Ambulatory Visit: Payer: Self-pay

## 2016-02-18 MED ORDER — LOSARTAN POTASSIUM 100 MG PO TABS: 100.0000 mg | ORAL_TABLET | Freq: Every day | ORAL | Status: DC

## 2016-02-21 DIAGNOSIS — M65311 Trigger thumb, right thumb: Secondary | ICD-10-CM | POA: Diagnosis not present

## 2016-03-03 NOTE — Progress Notes (Signed)
Erroneous encounter

## 2016-03-17 DIAGNOSIS — M67912 Unspecified disorder of synovium and tendon, left shoulder: Secondary | ICD-10-CM | POA: Diagnosis not present

## 2016-03-24 DIAGNOSIS — M25512 Pain in left shoulder: Secondary | ICD-10-CM | POA: Diagnosis not present

## 2016-04-01 DIAGNOSIS — M25512 Pain in left shoulder: Secondary | ICD-10-CM | POA: Diagnosis not present

## 2016-04-03 DIAGNOSIS — M25512 Pain in left shoulder: Secondary | ICD-10-CM | POA: Diagnosis not present

## 2016-04-07 DIAGNOSIS — M25512 Pain in left shoulder: Secondary | ICD-10-CM | POA: Diagnosis not present

## 2016-04-11 DIAGNOSIS — M25512 Pain in left shoulder: Secondary | ICD-10-CM | POA: Diagnosis not present

## 2016-04-18 ENCOUNTER — Other Ambulatory Visit (HOSPITAL_COMMUNITY): Payer: Self-pay | Admitting: Psychiatry

## 2016-05-07 ENCOUNTER — Telehealth: Payer: Self-pay | Admitting: Internal Medicine

## 2016-05-07 NOTE — Telephone Encounter (Signed)
Patient with GERD and IBS.  She will come in and see Ellouise Newer, [place] on 05/15/16

## 2016-05-07 NOTE — Telephone Encounter (Signed)
Left message for patient to call back  

## 2016-05-13 DIAGNOSIS — R05 Cough: Secondary | ICD-10-CM | POA: Diagnosis not present

## 2016-05-13 DIAGNOSIS — J3089 Other allergic rhinitis: Secondary | ICD-10-CM | POA: Diagnosis not present

## 2016-05-13 DIAGNOSIS — H1045 Other chronic allergic conjunctivitis: Secondary | ICD-10-CM | POA: Diagnosis not present

## 2016-05-13 DIAGNOSIS — K219 Gastro-esophageal reflux disease without esophagitis: Secondary | ICD-10-CM | POA: Diagnosis not present

## 2016-05-15 ENCOUNTER — Encounter (INDEPENDENT_AMBULATORY_CARE_PROVIDER_SITE_OTHER): Payer: Self-pay

## 2016-05-15 ENCOUNTER — Encounter: Payer: Self-pay | Admitting: Physician Assistant

## 2016-05-15 ENCOUNTER — Ambulatory Visit (INDEPENDENT_AMBULATORY_CARE_PROVIDER_SITE_OTHER): Payer: Medicare Other | Admitting: Physician Assistant

## 2016-05-15 VITALS — BP 122/77 | HR 88 | Ht 63.0 in | Wt 131.2 lb

## 2016-05-15 DIAGNOSIS — K580 Irritable bowel syndrome with diarrhea: Secondary | ICD-10-CM | POA: Diagnosis not present

## 2016-05-15 DIAGNOSIS — K219 Gastro-esophageal reflux disease without esophagitis: Secondary | ICD-10-CM

## 2016-05-15 NOTE — Progress Notes (Signed)
Agree w/ Ms. Lemmon's management  Gatha Mayer, MD, [facility]

## 2016-05-15 NOTE — Patient Instructions (Signed)
Keep your scheduled appointment with Dr Carlean Purl on 07/15/2016 at 9:45am  Low FODMAP Diet handout diet given

## 2016-05-15 NOTE — Progress Notes (Signed)
Chief Complaint: GERD, IBS  HPI:  [name] [name] is a 70 year old Caucasian female with a past medical history of anxiety, bipolar disorder, depressive disorder, reflux and IBS, who presents to clinic today for follow-up of her reflux and complaint of recent exacerbation of IBS. This patient typically follows with Dr. Carlean Purl, last seen on 02/15/2016.     Per chart review at that time the patient was having some reflux symptoms as well as hoarseness when she stopped her PPI, which was improved on over-the-counter PPI. At that time she was prescribed Esomeprazole 20 mg daily for 2 more months and then told to try and stop with GERD diet and precautions handout provided.     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 She [place] and was eating food that was way out of her diet, including ice cream which she had not eaten for 3 years, when she typically avoids lactose. The next day she had severe watery diarrhea and nausea which lasted for approximately 24 hours. All she could drink at this time was Gatorade which she "could hardly get down", she was bloated and her abdomen was hard during this time. After the patient returned home her symptoms have slowly gone away, though she does continue with some inconsistent bowel movements and knows that her IBS is "out of control". Patient has continued on VSL #3 one packet per day over this time. She denies any further abdominal pain. One week ago she started the low FODMAP diet after reading about this online and feels that this has helped with her bloating symptoms. She does ask questions regarding this diet today. The patient does express that she has been somewhat depressed in regards to this flare as the idea of eating "turns her off", for fear of repeat symptoms.  In relation to her reflux the patient tells me that she came off of her Nexium 20 mg a week  ago, she is not having any overt reflux symptoms other than during her severe IBS attack 2 weeks ago, when she did feel some acid in her throat upon waking. She believes she is doing "quite well" as far as her reflux goes without medication.  Patient denies fever, chills, blood in her stool, melena, continued severe diarrhea, weight loss, fatigue, continued nausea or vomiting, heartburn, reflux or abdominal pain.  Past Medical History:  Diagnosis Date  . Anxiety state, unspecified   . Bilateral shoulder pain   . Bipolar disorder, unspecified   . Depressive disorder, not elsewhere classified   . Diarrhea   . Esophageal reflux   . Hyperlipidemia   . Hypertension   . Irritable bowel syndrome   . Personal history of colonic polyps 2005   hyperplastic  . Thyroid disease   . Unspecified hemorrhoids without mention of complication     Past Surgical History:  Procedure Laterality Date  . COLONOSCOPY    . ESOPHAGOGASTRODUODENOSCOPY      Current Outpatient Prescriptions  Medication Sig Dispense Refill  . atorvastatin (LIPITOR) 20 MG tablet TAKE 1 TABLET ONCE DAILY. 30 tablet 11  . calcium carbonate (OS-CAL) 600 MG TABS Take 300 mg by mouth daily with breakfast.     . cetirizine (ZYRTEC) 10 MG tablet Take 10 mg by mouth daily.    . cholecalciferol (VITAMIN D) 1000 UNITS tablet Take 1,000 Units by mouth daily.    [name]  gabapentin (NEURONTIN) 400 MG tablet Take 2,000 mg by mouth at bedtime. For sleep     . losartan (COZAAR) 100 MG tablet Take 1 tablet (100 mg total) by mouth daily. 90 tablet 1  . nortriptyline (PAMELOR) 75 MG capsule Take 75 mg by mouth at bedtime.      . Probiotic Product (VSL#3 DS PO) Take by mouth once.    [name] SYNTHROID 25 MCG tablet TAKE 1 TABLET ONCE DAILY. 30 tablet 5  . temazepam (RESTORIL) 15 MG capsule Take 30 mg by mouth at bedtime as needed.     . traZODone (DESYREL) 100 MG tablet Take 200 mg by mouth at bedtime.      No current facility-administered medications for  this visit.     Allergies as of 05/15/2016  . (No Known Allergies)    Family History  Problem Relation Age of Onset  . Kidney disease Brother   . Heart disease Father   . Arthritis Mother   . Colon cancer Neg Hx     Social History   Social History  . Marital status: Divorced    Spouse name: N/A  . Number of children: 2  . Years of education: N/A   Occupational History  . Retired    Social History Main Topics  . Smoking status: Never Smoker  . Smokeless tobacco: Never Used  . Alcohol use No  . Drug use: No  . Sexual activity: Not on file   Other Topics Concern  . Not on file   Social History Narrative  . No narrative on file    Review of Systems:     Constitutional: No weight loss, fever, chills, weakness or fatigue HEENT: Eyes: No change in vision               Ears, Nose, Throat:  No change in hearing or congestion Skin: No rash or itching Cardiovascular: No chest pain, chest pressure or palpitations   Respiratory: No SOB or cough Gastrointestinal: See HPI and otherwise negative Genitourinary: No dysuria or change in urinary frequency Neurological: No headache, dizziness or syncope Musculoskeletal: No new muscle or joint pain Hematologic: No bleeding or bruising Psychiatric: Positive for depression and anxiety   Physical Exam:  Vital signs: BP 122/77   Pulse 88   Ht 5\' 3"  (1.6 m)   Wt 131 lb 3.2 oz (59.5 kg)   BMI 23.24 kg/m   General:   Caucasian female appears to be in NAD, Well developed, Well nourished, alert and cooperative Head:  Normocephalic and atraumatic. Eyes:   PEERL, EOMI. No icterus. Conjunctiva pink. Ears:  Normal auditory acuity. Neck:  Supple Throat: Oral cavity and pharynx without inflammation, swelling or lesion. Lungs: Respirations even and unlabored. Lungs clear to auscultation bilaterally.   No wheezes, crackles, or rhonchi.  Heart: Normal S1, S2. No MRG. Regular rate and rhythm. No peripheral edema, cyanosis or pallor.    Abdomen:  Soft, nondistended, nontender. No rebound or guarding. Normal bowel sounds. No appreciable masses or hepatomegaly. Rectal:  Not performed.  Msk:  Symmetrical without gross deformities. Extremities:  Without edema, no deformity or joint abnormality. Normal ROM. Neurologic:  Alert and  oriented x4;  grossly normal neurologically.  Skin:   Dry and intact without significant lesions or rashes. Psychiatric: Oriented to person, place and time. Demonstrates good judgement and reason without abnormal affect or behaviors.  RELEVANT LABS AND IMAGING: CBC    Component Value Date/Time   WBC 4.6 05/28/2015 0938   RBC  4.36 05/28/2015 0938   HGB 13.8 05/28/2015 0938   HCT 39.6 05/28/2015 0938   PLT 195 05/28/2015 0938   MCV 90.8 05/28/2015 0938   MCH 31.7 05/28/2015 0938   MCHC 34.8 05/28/2015 0938   RDW 13.2 05/28/2015 0938   LYMPHSABS 1.8 05/28/2015 0938   MONOABS 0.5 05/28/2015 0938   EOSABS 0.1 05/28/2015 0938   BASOSABS 0.0 05/28/2015 0938    CMP     Component Value Date/Time   NA 141 05/28/2015 0938   K 4.4 05/28/2015 0938   CL 104 05/28/2015 0938   CO2 28 05/28/2015 0938   GLUCOSE 91 05/28/2015 0938   BUN 18 05/28/2015 0938   CREATININE 1.07 (H) 05/28/2015 0938   CALCIUM 9.6 05/28/2015 0938   PROT 6.9 05/28/2015 0938   ALBUMIN 4.1 05/28/2015 0938   AST 21 05/28/2015 0938   ALT 18 05/28/2015 0938   ALKPHOS 58 05/28/2015 0938   BILITOT 0.4 05/28/2015 0938   GFRNONAA 53 (L) 05/28/2015 0938   GFRAA 61 05/28/2015 0938    Assessment: 1. [id number] history of IBS which is typically controlled with diet modification and probiotic, recently 2 weeks ago patient went on a trip and was not abiding by her typical diet and had a "severe flare of IBS", she is currently still recuperating from this 2. GERD: Patient is currently without symptoms and has been off of her Nexium for a week now  Plan: 1. Spent time discussing IBS and recommendations for this including a probiotic  and low FODMAP diet, explained that if the patient chooses to continue this diet, that she should stay on this for a total of 6 weeks and then start to try foods on the do not eat list, eliminating foods which cause her symptoms. Provided her with more information regarding this. 2. Recommend the patient increase her VSL #3-2 packets per day for the next 2 weeks and then go back to 1 packet per day 3. Reassured patient that since she was able to pinpoint exactly what caused the severe IBS flare that she should not be worried 4. At this time patient's reflux symptoms seem to be under control with diet and lifestyle modifications, recommend she continue this. If in the future she has a repeat of symptoms, could consider EGD as this has not been done for 20 years, discussed this with the patient. 5. Patient does have follow-up appointment scheduled with Dr. Carlean Purl in early December.  Ellouise Newer, PA-C [facility] 05/15/2016, 2:12 PM  Cc: Elby Showers, MD

## 2016-05-28 DIAGNOSIS — M546 Pain in thoracic spine: Secondary | ICD-10-CM | POA: Diagnosis not present

## 2016-05-28 DIAGNOSIS — S161XXA Strain of muscle, fascia and tendon at neck level, initial encounter: Secondary | ICD-10-CM | POA: Diagnosis not present

## 2016-05-28 DIAGNOSIS — M65311 Trigger thumb, right thumb: Secondary | ICD-10-CM | POA: Diagnosis not present

## 2016-06-17 ENCOUNTER — Other Ambulatory Visit: Payer: Medicare Other | Admitting: Internal Medicine

## 2016-06-17 DIAGNOSIS — E039 Hypothyroidism, unspecified: Secondary | ICD-10-CM

## 2016-06-17 DIAGNOSIS — E781 Pure hyperglyceridemia: Secondary | ICD-10-CM | POA: Diagnosis not present

## 2016-06-17 DIAGNOSIS — I10 Essential (primary) hypertension: Secondary | ICD-10-CM | POA: Diagnosis not present

## 2016-06-17 DIAGNOSIS — E348 Other specified endocrine disorders: Secondary | ICD-10-CM | POA: Diagnosis not present

## 2016-06-17 DIAGNOSIS — M8580 Other specified disorders of bone density and structure, unspecified site: Secondary | ICD-10-CM | POA: Diagnosis not present

## 2016-06-17 LAB — LIPID PANEL
CHOL/HDL RATIO: 2.8 ratio (ref <5.0)
Cholesterol: 142 mg/dL (ref <200)
HDL: 51 mg/dL (ref >50)
LDL Cholesterol: 57 mg/dL
TRIGLYCERIDES: 169 mg/dL — AB (ref <150)
VLDL: 34 mg/dL — ABNORMAL HIGH (ref <30)

## 2016-06-17 LAB — CBC WITH DIFFERENTIAL/PLATELET
BASOS PCT: 0 %
Basophils Absolute: 0 cells/uL (ref 0–200)
EOS PCT: 3 %
Eosinophils Absolute: 132 cells/uL (ref 15–500)
HCT: 40.4 % (ref 35.0–45.0)
Hemoglobin: 13.2 g/dL (ref 11.7–15.5)
Lymphocytes Relative: 37 %
Lymphs Abs: 1628 cells/uL (ref 850–3900)
MCH: 30.8 pg (ref 27.0–33.0)
MCHC: 32.7 g/dL (ref 32.0–36.0)
MCV: 94.4 fL (ref 80.0–100.0)
MONOS PCT: 7 %
MPV: 8.6 fL (ref 7.5–12.5)
Monocytes Absolute: 308 cells/uL (ref 200–950)
NEUTROS ABS: 2332 {cells}/uL (ref 1500–7800)
Neutrophils Relative %: 53 %
PLATELETS: 183 10*3/uL (ref 140–400)
RBC: 4.28 MIL/uL (ref 3.80–5.10)
RDW: 13.5 % (ref 11.0–15.0)
WBC: 4.4 10*3/uL (ref 3.8–10.8)

## 2016-06-17 LAB — COMPLETE METABOLIC PANEL WITH GFR
ALBUMIN: 4.2 g/dL (ref 3.6–5.1)
ALK PHOS: 58 U/L (ref 33–130)
ALT: 21 U/L (ref 6–29)
AST: 22 U/L (ref 10–35)
BILIRUBIN TOTAL: 0.4 mg/dL (ref 0.2–1.2)
BUN: 17 mg/dL (ref 7–25)
CALCIUM: 9.2 mg/dL (ref 8.6–10.4)
CHLORIDE: 106 mmol/L (ref 98–110)
CO2: 23 mmol/L (ref 20–31)
CREATININE: 1.04 mg/dL — AB (ref 0.60–0.93)
GFR, EST AFRICAN AMERICAN: 62 mL/min (ref >=60)
GFR, Est Non African American: 54 mL/min — ABNORMAL LOW (ref >=60)
Glucose, Bld: 86 mg/dL (ref 65–99)
Potassium: 4.4 mmol/L (ref 3.5–5.3)
Sodium: 143 mmol/L (ref 135–146)
TOTAL PROTEIN: 6.3 g/dL (ref 6.1–8.1)

## 2016-06-17 LAB — TSH: TSH: 1.52 m[IU]/L

## 2016-06-18 LAB — VITAMIN D 25 HYDROXY (VIT D DEFICIENCY, FRACTURES): Vit D, 25-Hydroxy: 50 ng/mL (ref 30–100)

## 2016-06-20 ENCOUNTER — Encounter: Payer: Self-pay | Admitting: Internal Medicine

## 2016-06-20 ENCOUNTER — Ambulatory Visit (INDEPENDENT_AMBULATORY_CARE_PROVIDER_SITE_OTHER): Payer: Medicare Other | Admitting: Internal Medicine

## 2016-06-20 VITALS — BP 128/74 | HR 95 | Temp 98.3°F | Ht 63.0 in | Wt 129.0 lb

## 2016-06-20 DIAGNOSIS — Z0000 Encounter for general adult medical examination without abnormal findings: Secondary | ICD-10-CM | POA: Diagnosis not present

## 2016-06-20 DIAGNOSIS — N390 Urinary tract infection, site not specified: Secondary | ICD-10-CM | POA: Diagnosis not present

## 2016-06-20 DIAGNOSIS — F3170 Bipolar disorder, currently in remission, most recent episode unspecified: Secondary | ICD-10-CM

## 2016-06-20 DIAGNOSIS — Z23 Encounter for immunization: Secondary | ICD-10-CM | POA: Diagnosis not present

## 2016-06-20 DIAGNOSIS — R8290 Unspecified abnormal findings in urine: Secondary | ICD-10-CM | POA: Diagnosis not present

## 2016-06-20 DIAGNOSIS — M8580 Other specified disorders of bone density and structure, unspecified site: Secondary | ICD-10-CM

## 2016-06-20 DIAGNOSIS — R319 Hematuria, unspecified: Secondary | ICD-10-CM | POA: Diagnosis not present

## 2016-06-20 DIAGNOSIS — K589 Irritable bowel syndrome without diarrhea: Secondary | ICD-10-CM | POA: Diagnosis not present

## 2016-06-20 DIAGNOSIS — K219 Gastro-esophageal reflux disease without esophagitis: Secondary | ICD-10-CM | POA: Diagnosis not present

## 2016-06-20 DIAGNOSIS — E781 Pure hyperglyceridemia: Secondary | ICD-10-CM | POA: Diagnosis not present

## 2016-06-20 LAB — POC URINALSYSI DIPSTICK (AUTOMATED)
BILIRUBIN UA: NEGATIVE
Glucose, UA: NEGATIVE
KETONES UA: NEGATIVE
Nitrite, UA: NEGATIVE
PH UA: 8
Protein, UA: NEGATIVE
SPEC GRAV UA: 1.01
Urobilinogen, UA: NEGATIVE

## 2016-06-22 LAB — URINE CULTURE

## 2016-06-25 DIAGNOSIS — M8588 Other specified disorders of bone density and structure, other site: Secondary | ICD-10-CM | POA: Diagnosis not present

## 2016-06-30 DIAGNOSIS — L853 Xerosis cutis: Secondary | ICD-10-CM | POA: Diagnosis not present

## 2016-06-30 DIAGNOSIS — Z85828 Personal history of other malignant neoplasm of skin: Secondary | ICD-10-CM | POA: Diagnosis not present

## 2016-06-30 DIAGNOSIS — D1801 Hemangioma of skin and subcutaneous tissue: Secondary | ICD-10-CM | POA: Diagnosis not present

## 2016-06-30 DIAGNOSIS — L821 Other seborrheic keratosis: Secondary | ICD-10-CM | POA: Diagnosis not present

## 2016-06-30 DIAGNOSIS — L720 Epidermal cyst: Secondary | ICD-10-CM | POA: Diagnosis not present

## 2016-06-30 DIAGNOSIS — L738 Other specified follicular disorders: Secondary | ICD-10-CM | POA: Diagnosis not present

## 2016-06-30 DIAGNOSIS — D2272 Melanocytic nevi of left lower limb, including hip: Secondary | ICD-10-CM | POA: Diagnosis not present

## 2016-07-08 ENCOUNTER — Other Ambulatory Visit: Payer: Self-pay | Admitting: Internal Medicine

## 2016-07-08 NOTE — Patient Instructions (Addendum)
It was a pleasure to see you today. Continue same medications and return in one year or as needed. 
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
## 2016-07-11 DIAGNOSIS — H5203 Hypermetropia, bilateral: Secondary | ICD-10-CM | POA: Diagnosis not present

## 2016-07-11 DIAGNOSIS — H52223 Regular astigmatism, bilateral: Secondary | ICD-10-CM | POA: Diagnosis not present

## 2016-07-11 DIAGNOSIS — H26492 Other secondary cataract, left eye: Secondary | ICD-10-CM | POA: Diagnosis not present

## 2016-07-11 DIAGNOSIS — H26491 Other secondary cataract, right eye: Secondary | ICD-10-CM | POA: Diagnosis not present

## 2016-07-11 DIAGNOSIS — H04129 Dry eye syndrome of unspecified lacrimal gland: Secondary | ICD-10-CM | POA: Diagnosis not present

## 2016-07-15 ENCOUNTER — Ambulatory Visit: Payer: Medicare Other | Admitting: Internal Medicine

## 2016-07-22 ENCOUNTER — Other Ambulatory Visit: Payer: Self-pay | Admitting: Internal Medicine

## 2016-08-01 DIAGNOSIS — J3089 Other allergic rhinitis: Secondary | ICD-10-CM | POA: Diagnosis not present

## 2016-08-01 DIAGNOSIS — K219 Gastro-esophageal reflux disease without esophagitis: Secondary | ICD-10-CM | POA: Diagnosis not present

## 2016-08-01 DIAGNOSIS — H1045 Other chronic allergic conjunctivitis: Secondary | ICD-10-CM | POA: Diagnosis not present

## 2016-08-01 DIAGNOSIS — J019 Acute sinusitis, unspecified: Secondary | ICD-10-CM | POA: Diagnosis not present

## 2016-08-18 DIAGNOSIS — F331 Major depressive disorder, recurrent, moderate: Secondary | ICD-10-CM | POA: Diagnosis not present

## 2016-08-20 ENCOUNTER — Encounter: Payer: Self-pay | Admitting: Internal Medicine

## 2016-08-31 ENCOUNTER — Other Ambulatory Visit: Payer: Self-pay | Admitting: Internal Medicine

## 2016-09-19 ENCOUNTER — Encounter: Payer: Self-pay | Admitting: Internal Medicine

## 2016-09-19 DIAGNOSIS — Z1231 Encounter for screening mammogram for malignant neoplasm of breast: Secondary | ICD-10-CM | POA: Diagnosis not present

## 2016-09-26 DIAGNOSIS — R6889 Other general symptoms and signs: Secondary | ICD-10-CM | POA: Diagnosis not present

## 2016-09-26 DIAGNOSIS — R69 Illness, unspecified: Secondary | ICD-10-CM | POA: Diagnosis not present

## 2016-10-14 ENCOUNTER — Other Ambulatory Visit: Payer: Self-pay | Admitting: Internal Medicine

## 2016-10-18 DIAGNOSIS — A084 Viral intestinal infection, unspecified: Secondary | ICD-10-CM | POA: Diagnosis not present

## 2016-10-18 DIAGNOSIS — R0602 Shortness of breath: Secondary | ICD-10-CM | POA: Diagnosis not present

## 2016-10-18 DIAGNOSIS — R197 Diarrhea, unspecified: Secondary | ICD-10-CM | POA: Diagnosis not present

## 2016-10-18 DIAGNOSIS — R110 Nausea: Secondary | ICD-10-CM | POA: Diagnosis not present

## 2016-10-21 ENCOUNTER — Telehealth: Payer: Self-pay | Admitting: Internal Medicine

## 2016-10-21 NOTE — Telephone Encounter (Addendum)
She went to an Urgent Care on [address]) Saturday evening because her IBS was acting up again.  She was having nausea/diarrhea/vomiting and she worked herself up into a panic attack.  She feels as though her BP went up.    She saw Dr. Carlean Purl (well, she saw the PA) for these issues.  She was actually never able to see Dr. Carlean Purl and really was never happy with the inability to not be able to see Dr. Carlean Purl.  For this reason she is going to see a GI doctor at [facility] (Dr. Eduard Roux).  She will see him 11/12/16.  She has been wanting to see this Dr. Nyoka Cowden for a while now.  She is on their waiting list; so if she can get in prior to 4/4, she'll be seen before then.  She just wanted to call and let you know all of this information.    I told her that we would be happy to send records if she needed.  However, she said that Dr. Rolly Salter office had requested records from Dr. Celesta Aver office.    So, I think the call is more of an informative nature.

## 2016-11-12 DIAGNOSIS — R140 Abdominal distension (gaseous): Secondary | ICD-10-CM | POA: Diagnosis not present

## 2016-11-12 DIAGNOSIS — I10 Essential (primary) hypertension: Secondary | ICD-10-CM | POA: Diagnosis not present

## 2016-11-12 DIAGNOSIS — K582 Mixed irritable bowel syndrome: Secondary | ICD-10-CM | POA: Diagnosis not present

## 2016-11-12 DIAGNOSIS — K219 Gastro-esophageal reflux disease without esophagitis: Secondary | ICD-10-CM | POA: Diagnosis not present

## 2016-11-12 DIAGNOSIS — Z79899 Other long term (current) drug therapy: Secondary | ICD-10-CM | POA: Diagnosis not present

## 2016-11-12 DIAGNOSIS — K5909 Other constipation: Secondary | ICD-10-CM | POA: Diagnosis not present

## 2016-11-26 DIAGNOSIS — R140 Abdominal distension (gaseous): Secondary | ICD-10-CM | POA: Diagnosis not present

## 2016-11-26 DIAGNOSIS — A049 Bacterial intestinal infection, unspecified: Secondary | ICD-10-CM | POA: Diagnosis not present

## 2016-12-06 ENCOUNTER — Other Ambulatory Visit: Payer: Self-pay | Admitting: Internal Medicine

## 2017-01-12 DIAGNOSIS — D045 Carcinoma in situ of skin of trunk: Secondary | ICD-10-CM | POA: Diagnosis not present

## 2017-01-12 DIAGNOSIS — Z85828 Personal history of other malignant neoplasm of skin: Secondary | ICD-10-CM | POA: Diagnosis not present

## 2017-01-12 DIAGNOSIS — I788 Other diseases of capillaries: Secondary | ICD-10-CM | POA: Diagnosis not present

## 2017-01-14 DIAGNOSIS — H04123 Dry eye syndrome of bilateral lacrimal glands: Secondary | ICD-10-CM | POA: Diagnosis not present

## 2017-01-14 DIAGNOSIS — H52223 Regular astigmatism, bilateral: Secondary | ICD-10-CM | POA: Diagnosis not present

## 2017-01-14 DIAGNOSIS — Z961 Presence of intraocular lens: Secondary | ICD-10-CM | POA: Diagnosis not present

## 2017-01-14 DIAGNOSIS — H26492 Other secondary cataract, left eye: Secondary | ICD-10-CM | POA: Diagnosis not present

## 2017-01-14 DIAGNOSIS — H5203 Hypermetropia, bilateral: Secondary | ICD-10-CM | POA: Diagnosis not present

## 2017-01-20 DIAGNOSIS — Z85828 Personal history of other malignant neoplasm of skin: Secondary | ICD-10-CM | POA: Diagnosis not present

## 2017-01-20 DIAGNOSIS — L0889 Other specified local infections of the skin and subcutaneous tissue: Secondary | ICD-10-CM | POA: Diagnosis not present

## 2017-01-20 DIAGNOSIS — L0101 Non-bullous impetigo: Secondary | ICD-10-CM | POA: Diagnosis not present

## 2017-01-22 DIAGNOSIS — K219 Gastro-esophageal reflux disease without esophagitis: Secondary | ICD-10-CM | POA: Diagnosis not present

## 2017-01-22 DIAGNOSIS — R05 Cough: Secondary | ICD-10-CM | POA: Diagnosis not present

## 2017-01-22 DIAGNOSIS — J3089 Other allergic rhinitis: Secondary | ICD-10-CM | POA: Diagnosis not present

## 2017-01-22 DIAGNOSIS — H1045 Other chronic allergic conjunctivitis: Secondary | ICD-10-CM | POA: Diagnosis not present

## 2017-01-31 ENCOUNTER — Encounter (HOSPITAL_COMMUNITY): Payer: Self-pay | Admitting: Emergency Medicine

## 2017-01-31 ENCOUNTER — Emergency Department (HOSPITAL_COMMUNITY)
Admission: EM | Admit: 2017-01-31 | Discharge: 2017-01-31 | Disposition: A | Discharge status: Home / Self Care | Payer: Medicare Other | Attending: Emergency Medicine | Admitting: Emergency Medicine

## 2017-01-31 ENCOUNTER — Emergency Department (HOSPITAL_COMMUNITY): Payer: Medicare Other

## 2017-01-31 DIAGNOSIS — I10 Essential (primary) hypertension: Secondary | ICD-10-CM | POA: Insufficient documentation

## 2017-01-31 DIAGNOSIS — R112 Nausea with vomiting, unspecified: Secondary | ICD-10-CM | POA: Diagnosis not present

## 2017-01-31 DIAGNOSIS — E039 Hypothyroidism, unspecified: Secondary | ICD-10-CM | POA: Diagnosis not present

## 2017-01-31 DIAGNOSIS — Z79899 Other long term (current) drug therapy: Secondary | ICD-10-CM | POA: Diagnosis not present

## 2017-01-31 DIAGNOSIS — K2970 Gastritis, unspecified, without bleeding: Secondary | ICD-10-CM | POA: Diagnosis not present

## 2017-01-31 DIAGNOSIS — R109 Unspecified abdominal pain: Secondary | ICD-10-CM | POA: Diagnosis present

## 2017-01-31 DIAGNOSIS — R531 Weakness: Secondary | ICD-10-CM | POA: Diagnosis not present

## 2017-01-31 NOTE — ED Notes (Signed)
Bed: WA03 Expected date:  Expected time:  Means of arrival:  Comments: 71 yo abd pain; constiptation

## 2017-01-31 NOTE — Discharge Instructions (Signed)
X-ray showed lots of stool in your colon. Recommend cathartic medicine such as milk of magnesia, Miralax, magnesium citrate

## 2017-01-31 NOTE — ED Provider Notes (Signed)
[facility] DEPT Provider Note   CSN: [id number] Arrival date & time: 01/31/17  1010     History   Chief Complaint Chief Complaint  Patient presents with  . Abdominal Pain  . Constipation    HPI [name] [name] is a 71 y.o. female.  Patient presents with severe abdominal pain early this afternoon, now resolved. She sees a gastroenterologist at [facility] and was diagnosed with SIBO (small intestine bacterial overgrowth).  No fever, sweats, chills, vomiting, diarrhea. She has a history of intermittent constipation; however, she did have a bowel movement today. Symptoms have now completely resolved. No history of abdominal surgery or small bowel obstruction.      Past Medical History:  Diagnosis Date  . Anxiety state, unspecified   . Bilateral shoulder pain   . Bipolar disorder, unspecified ([place])   . Depressive disorder, not elsewhere classified   . Diarrhea   . Esophageal reflux   . Hyperlipidemia   . Hypertension   . Irritable bowel syndrome   . Personal history of colonic polyps 2005   hyperplastic  . Thyroid disease   . Unspecified hemorrhoids without mention of complication     Patient Active Problem List   Diagnosis Date Noted  . IBS (irritable bowel syndrome) 02/27/2015  . PTSD (post-traumatic stress disorder) 02/27/2015  . Hypertriglyceridemia 04/09/2013  . Allergic rhinitis 07/10/2011  . Hypothyroidism 04/29/2011  . HTN (hypertension) 04/29/2011  . BIPOLAR AFFECTIVE DISORDER 06/19/2008  . ANXIETY 06/19/2008  . DEPRESSION 06/19/2008  . HEMORRHOIDS 06/19/2008  . GERD 06/19/2008  . COLONIC POLYPS, HX OF 06/19/2008    Past Surgical History:  Procedure Laterality Date  . COLONOSCOPY    . ESOPHAGOGASTRODUODENOSCOPY      OB History    No data available       Home Medications    Prior to Admission medications   Medication Sig Start Date End Date Taking? Authorizing Provider  atorvastatin (LIPITOR) 20 MG tablet TAKE 1 TABLET ONCE DAILY.  10/14/16  Yes Baxley, Cresenciano Lick, MD  calcium carbonate (OS-CAL) 600 MG TABS Take 300 mg by mouth daily with breakfast.    Yes [provider]  cholecalciferol (VITAMIN D) 1000 UNITS tablet Take 1,000 Units by mouth daily.   Yes [provider]  esomeprazole (NEXIUM) 20 MG capsule Take 40 mg by mouth daily.   Yes [provider]  gabapentin (NEURONTIN) 400 MG tablet Take 2,000 mg by mouth at bedtime. For sleep    Yes [provider]  losartan (COZAAR) 100 MG tablet TAKE 1 TABLET ONCE DAILY. 12/06/16  Yes Baxley, Cresenciano Lick, MD  nortriptyline (PAMELOR) 75 MG capsule Take 75 mg by mouth at bedtime.     Yes [provider]  Probiotic Product (VSL#3 DS PO) Take 1 tablet by mouth daily.    Yes [provider]  SYNTHROID 25 MCG tablet TAKE 1 TABLET ONCE DAILY. 07/22/16  Yes Baxley, Cresenciano Lick, MD  temazepam (RESTORIL) 15 MG capsule Take 30 mg by mouth at bedtime as needed.    Yes [provider]  traZODone (DESYREL) 100 MG tablet Take 200 mg by mouth at bedtime.  04/03/13  Yes [provider]  cephALEXin (KEFLEX) 500 MG capsule Take 500 mg by mouth 2 (two) times daily. 01/23/17   [provider]  cetirizine (ZYRTEC) 10 MG tablet Take 10 mg by mouth daily.    [provider]  mupirocin ointment (BACTROBAN) 2 % Apply 1 application topically daily. 01/20/17   [provider]    Family History Family History  Problem Relation Age of Onset  . Kidney disease Brother   . Heart disease Father   . Arthritis Mother   . Colon cancer Neg Hx     Social History Social History  Substance Use Topics  . Smoking status: Never Smoker  . Smokeless tobacco: Never Used  . Alcohol use No     Allergies   Patient has no known allergies.   Review of Systems Review of Systems  All other systems reviewed and are negative.    Physical Exam Updated Vital Signs BP 131/70   Pulse 78   Temp 98.7 F (37.1 C) (Oral)   Resp 16    Ht 5\' 4"  (1.626 m)   Wt 58.5 kg (129 lb)   SpO2 99%   BMI 22.14 kg/m   Physical Exam  Constitutional: She is oriented to person, place, and time. She appears well-developed and well-nourished.  HENT:  Head: Normocephalic and atraumatic.  Eyes: Conjunctivae are normal.  Neck: Neck supple.  Cardiovascular: Normal rate and regular rhythm.   Pulmonary/Chest: Effort normal and breath sounds normal.  Abdominal: Soft. Bowel sounds are normal.  Musculoskeletal: Normal range of motion.  Neurological: She is alert and oriented to person, place, and time.  Skin: Skin is warm and dry.  Psychiatric: She has a normal mood and affect. Her behavior is normal.  Nursing note and vitals reviewed.    ED Treatments / Results  Labs (all labs ordered are listed, but only abnormal results are displayed) Labs Reviewed - No data to display  EKG  EKG Interpretation None       Radiology Dg Abdomen Acute W/chest  Result Date: 01/31/2017 CLINICAL DATA:  Pain and weakness. EXAM: DG ABDOMEN ACUTE W/ 1V CHEST COMPARISON:  May 11, 2008 FINDINGS: The heart, hila, mediastinum, lungs, and pleura are unremarkable. No free air, portal venous gas, or pneumatosis. A few air-fluid levels are seen in bowel loops in the right abdomen. No dilated loops of bowel are noted. Fecal loading is seen in the left colon. IMPRESSION: 1. Normal chest. 2. A few nonspecific air-fluid levels are seen in bowel loops in the right side of the abdomen. No dilated loops of bowel are identified to suggest obstruction. Fecal loading in the left colon. Electronically Signed   By: Dorise Bullion III M.D   On: 01/31/2017 11:52    Procedures Procedures (including critical care time)  Medications Ordered in ED Medications - No data to display   Initial Impression / Assessment and Plan / ED Course  I have reviewed the triage vital signs and the nursing notes.  Pertinent labs & imaging results that were available during my care of  the patient were reviewed by me and considered in my medical decision making (see chart for details).     Patient symptoms have totally resolved. Acute abdominal series reveals excessive stool in the left colon but no obstruction. This was discussed with the patient, and her friend  Final Clinical Impressions(s) / ED Diagnoses   Final diagnoses:  Abdominal pain, unspecified abdominal location    New Prescriptions Discharge Medication List as of 01/31/2017  1:29 PM       Nat Christen, MD 01/31/17 1356

## 2017-01-31 NOTE — ED Triage Notes (Signed)
Patient is from home.  Patient is complaining of chronic constipation for the last 30 minutes.  Patient states she feels better, and pain is at a 0.  EMS sit patient up and the pain went away.    BP:  118/64 HR:94 R:16 O2: 95% room air

## 2017-01-31 NOTE — ED Triage Notes (Signed)
Patient states she fell today and hit head on carpet.  Fall was due to abdominal pain.  Denies dizzy spells, LOC, and blackouts.

## 2017-01-31 NOTE — ED Notes (Signed)
Pt states that she feels fine.  She drank ice water and tolerated this well.

## 2017-02-03 DIAGNOSIS — M25512 Pain in left shoulder: Secondary | ICD-10-CM | POA: Diagnosis not present

## 2017-02-10 ENCOUNTER — Other Ambulatory Visit: Payer: Self-pay | Admitting: Internal Medicine

## 2017-02-19 DIAGNOSIS — K6389 Other specified diseases of intestine: Secondary | ICD-10-CM | POA: Diagnosis not present

## 2017-02-19 DIAGNOSIS — R140 Abdominal distension (gaseous): Secondary | ICD-10-CM | POA: Diagnosis not present

## 2017-02-19 DIAGNOSIS — Z79899 Other long term (current) drug therapy: Secondary | ICD-10-CM | POA: Diagnosis not present

## 2017-02-19 DIAGNOSIS — K5909 Other constipation: Secondary | ICD-10-CM | POA: Diagnosis not present

## 2017-02-19 DIAGNOSIS — K219 Gastro-esophageal reflux disease without esophagitis: Secondary | ICD-10-CM | POA: Diagnosis not present

## 2017-02-19 DIAGNOSIS — I10 Essential (primary) hypertension: Secondary | ICD-10-CM | POA: Diagnosis not present

## 2017-03-03 DIAGNOSIS — L905 Scar conditions and fibrosis of skin: Secondary | ICD-10-CM | POA: Diagnosis not present

## 2017-03-03 DIAGNOSIS — L720 Epidermal cyst: Secondary | ICD-10-CM | POA: Diagnosis not present

## 2017-03-03 DIAGNOSIS — Z85828 Personal history of other malignant neoplasm of skin: Secondary | ICD-10-CM | POA: Diagnosis not present

## 2017-03-08 ENCOUNTER — Other Ambulatory Visit: Payer: Self-pay | Admitting: Internal Medicine

## 2017-04-21 ENCOUNTER — Other Ambulatory Visit: Payer: Self-pay | Admitting: Internal Medicine

## 2017-05-05 DIAGNOSIS — B029 Zoster without complications: Secondary | ICD-10-CM | POA: Diagnosis not present

## 2017-05-07 ENCOUNTER — Telehealth: Payer: Self-pay | Admitting: Internal Medicine

## 2017-05-07 NOTE — Telephone Encounter (Signed)
See tomorrow

## 2017-05-07 NOTE — Telephone Encounter (Signed)
Patient calls stating that she went to [facility] on Tuesday evening and was diagnosed with Shingles.  She's on Valcyclovir 1gm TID x 7 days.  She started this on Tuesday evening.  Says that the outbreak is on her back.  It was itching and has never been painful for her and is still not painful.  There are 4 huge red spots on her back.  The itching has stopped.  The bumps are still there, but are just bumps now.  She's tired, nauseous, has gas and is bloated.  She has no appetite.  She is worried if any of her other medications could be reacting with the Valcyclovir.    She's wondering why she's feeling so blah and exhausted.  Advised that this could take her a good bit to get over.  She said that she just wants to hear from "HER" doctor that she's going to be okay on this medication in addition to her other medications.    Do you want to see her tomorrow or next week??    Best # for contact:  [phone]  Thank you.

## 2017-05-08 ENCOUNTER — Encounter: Payer: Self-pay | Admitting: Internal Medicine

## 2017-05-08 ENCOUNTER — Ambulatory Visit (INDEPENDENT_AMBULATORY_CARE_PROVIDER_SITE_OTHER): Payer: Medicare Other | Admitting: Internal Medicine

## 2017-05-08 VITALS — BP 112/60 | HR 94 | Temp 97.6°F | Wt 129.0 lb

## 2017-05-08 DIAGNOSIS — I10 Essential (primary) hypertension: Secondary | ICD-10-CM

## 2017-05-08 DIAGNOSIS — B029 Zoster without complications: Secondary | ICD-10-CM | POA: Diagnosis not present

## 2017-05-08 DIAGNOSIS — F419 Anxiety disorder, unspecified: Secondary | ICD-10-CM | POA: Diagnosis not present

## 2017-05-08 DIAGNOSIS — F3170 Bipolar disorder, currently in remission, most recent episode unspecified: Secondary | ICD-10-CM

## 2017-05-08 NOTE — Patient Instructions (Signed)
Finish course of Valtrex as previously prescribed at urgent care. Call if not better next week or sooner if worse. Calamine lotion to lesions.

## 2017-05-08 NOTE — Progress Notes (Signed)
   Subjective:    Patient ID: [name] [name], female    DOB: 02/20/1946, 71 y.o.   MRN: [id number]  HPI Patient has a history of anxiety and bipolar disorder. She was diagnosed recently at an urgent care on September 25 with Herpes zoster. She has had zoster vaccine in 2013. At urgent care was placed on Valtrex 1000 mg 3 times daily for 7 days. which she continues to take.  She has some other lesions that she is concerned about.  She is very anxious. Has been lethargic and having some flulike symptoms. She is worried that this may get worse.    Review of Systems see above-says rash on right back is not really itchy but feels irritated     Objective:   Physical Exam  She has 4 lesions in a semicircle right upper back near scapula each of which has a distinct central vesicle.  She has a papule on her anterior abdomen that looks to be an insect bite      Assessment & Plan:  Herpes zoster  Plan: Finish course of Valtrex. May apply calamine lotion to these lesions. Call if not improved next week or sooner if worse. Patient reassured.

## 2017-06-02 ENCOUNTER — Other Ambulatory Visit: Payer: Self-pay | Admitting: Internal Medicine

## 2017-06-02 ENCOUNTER — Telehealth: Payer: Self-pay | Admitting: Internal Medicine

## 2017-06-02 NOTE — Telephone Encounter (Signed)
Can see nurse for UTI check

## 2017-06-02 NOTE — Telephone Encounter (Signed)
Spoke with patient and offered an appointment for Nurse Visit for UTI check this afternoon.  Patient stated she appreciates that.  If she does come, she will most likely come on Thursday.  She is still waiting on Dr. Casimiro Needle to call her back.  Advised that Dr. Renold Genta will defer to Dr. Casimiro Needle for the sleep issues.  Patient verbalized understanding of our conversation.

## 2017-06-02 NOTE — Telephone Encounter (Signed)
Patient calling because she's having trouble sleeping.  States that she has been seeing her Psychiatrist, Dr. Casimiro Needle r/t adjusting her sleep medications because she's waking up around 3:00 a.m. Because she needs to urinate.  After she gets up to urinate she cannot go back to sleep because she's too anxious.  States that she is going through a lot at the moment.    Isn't sure if she has a UTI or not; doesn't really have any symptoms.  However, this frequency at night does seem to be a new issue for her.    She wanted you to know about the urinary issue.  I advised that if her psychiatrist prescribed the medications, she was correct in seeing him.  She is waiting on a call back from him today as well.  I told her we could see her for the questionable UTI, but I'm not sure how else we can be of assistance.    I told her I would provide the message to you.

## 2017-06-22 ENCOUNTER — Other Ambulatory Visit: Payer: Medicare Other | Admitting: Internal Medicine

## 2017-06-22 DIAGNOSIS — E039 Hypothyroidism, unspecified: Secondary | ICD-10-CM | POA: Diagnosis not present

## 2017-06-22 DIAGNOSIS — K219 Gastro-esophageal reflux disease without esophagitis: Secondary | ICD-10-CM | POA: Diagnosis not present

## 2017-06-22 DIAGNOSIS — I10 Essential (primary) hypertension: Secondary | ICD-10-CM | POA: Diagnosis not present

## 2017-06-22 DIAGNOSIS — Z0000 Encounter for general adult medical examination without abnormal findings: Secondary | ICD-10-CM | POA: Diagnosis not present

## 2017-06-22 DIAGNOSIS — J309 Allergic rhinitis, unspecified: Secondary | ICD-10-CM

## 2017-06-22 DIAGNOSIS — F411 Generalized anxiety disorder: Secondary | ICD-10-CM | POA: Diagnosis not present

## 2017-06-22 DIAGNOSIS — E781 Pure hyperglyceridemia: Secondary | ICD-10-CM | POA: Diagnosis not present

## 2017-06-22 LAB — LIPID PANEL
CHOL/HDL RATIO: 2.8 (calc) (ref <5.0)
Cholesterol: 142 mg/dL (ref <200)
HDL: 50 mg/dL — ABNORMAL LOW (ref >50)
LDL Cholesterol (Calc): 69 mg/dL (calc)
Non-HDL Cholesterol (Calc): 92 mg/dL (calc) (ref <130)
Triglycerides: 157 mg/dL — ABNORMAL HIGH (ref <150)

## 2017-06-22 LAB — CBC WITH DIFFERENTIAL/PLATELET
BASOS PCT: 0.7 %
Basophils Absolute: 32 cells/uL (ref 0–200)
EOS ABS: 90 {cells}/uL (ref 15–500)
Eosinophils Relative: 2 %
HCT: 38.5 % (ref 35.0–45.0)
HEMOGLOBIN: 13.1 g/dL (ref 11.7–15.5)
Lymphs Abs: 1809 cells/uL (ref 850–3900)
MCH: 30.8 pg (ref 27.0–33.0)
MCHC: 34 g/dL (ref 32.0–36.0)
MCV: 90.6 fL (ref 80.0–100.0)
MPV: 9.5 fL (ref 7.5–12.5)
Monocytes Relative: 8.2 %
NEUTROS ABS: 2201 {cells}/uL (ref 1500–7800)
Neutrophils Relative %: 48.9 %
PLATELETS: 202 10*3/uL (ref 140–400)
RBC: 4.25 10*6/uL (ref 3.80–5.10)
RDW: 12 % (ref 11.0–15.0)
TOTAL LYMPHOCYTE: 40.2 %
WBC: 4.5 10*3/uL (ref 3.8–10.8)
WBCMIX: 369 {cells}/uL (ref 200–950)

## 2017-06-22 LAB — COMPLETE METABOLIC PANEL WITH GFR
AG RATIO: 1.8 (calc) (ref 1.0–2.5)
ALT: 18 U/L (ref 6–29)
AST: 21 U/L (ref 10–35)
Albumin: 4.4 g/dL (ref 3.6–5.1)
Alkaline phosphatase (APISO): 69 U/L (ref 33–130)
BILIRUBIN TOTAL: 0.4 mg/dL (ref 0.2–1.2)
BUN/Creatinine Ratio: 18 (calc) (ref 6–22)
BUN: 21 mg/dL (ref 7–25)
CO2: 30 mmol/L (ref 20–32)
Calcium: 9.6 mg/dL (ref 8.6–10.4)
Chloride: 106 mmol/L (ref 98–110)
Creat: 1.16 mg/dL — ABNORMAL HIGH (ref 0.60–0.93)
GFR, EST AFRICAN AMERICAN: 54 mL/min/{1.73_m2} — AB (ref >=60)
GFR, Est Non African American: 47 mL/min/{1.73_m2} — ABNORMAL LOW (ref >=60)
GLUCOSE: 92 mg/dL (ref 65–99)
Globulin: 2.5 g/dL (calc) (ref 1.9–3.7)
Potassium: 4.3 mmol/L (ref 3.5–5.3)
Sodium: 142 mmol/L (ref 135–146)
TOTAL PROTEIN: 6.9 g/dL (ref 6.1–8.1)

## 2017-06-22 LAB — TSH: TSH: 1.53 mIU/L (ref 0.40–4.50)

## 2017-06-26 ENCOUNTER — Ambulatory Visit (INDEPENDENT_AMBULATORY_CARE_PROVIDER_SITE_OTHER): Payer: Medicare Other | Admitting: Internal Medicine

## 2017-06-26 ENCOUNTER — Encounter: Payer: Self-pay | Admitting: Internal Medicine

## 2017-06-26 VITALS — BP 132/70 | HR 89 | Temp 97.6°F | Ht 63.5 in | Wt 127.0 lb

## 2017-06-26 DIAGNOSIS — M8580 Other specified disorders of bone density and structure, unspecified site: Secondary | ICD-10-CM | POA: Diagnosis not present

## 2017-06-26 DIAGNOSIS — R7989 Other specified abnormal findings of blood chemistry: Secondary | ICD-10-CM

## 2017-06-26 DIAGNOSIS — E781 Pure hyperglyceridemia: Secondary | ICD-10-CM

## 2017-06-26 DIAGNOSIS — Z23 Encounter for immunization: Secondary | ICD-10-CM

## 2017-06-26 DIAGNOSIS — F3170 Bipolar disorder, currently in remission, most recent episode unspecified: Secondary | ICD-10-CM

## 2017-06-26 DIAGNOSIS — I10 Essential (primary) hypertension: Secondary | ICD-10-CM

## 2017-06-26 DIAGNOSIS — K589 Irritable bowel syndrome without diarrhea: Secondary | ICD-10-CM | POA: Diagnosis not present

## 2017-06-26 DIAGNOSIS — Z0000 Encounter for general adult medical examination without abnormal findings: Secondary | ICD-10-CM | POA: Diagnosis not present

## 2017-06-26 DIAGNOSIS — K219 Gastro-esophageal reflux disease without esophagitis: Secondary | ICD-10-CM | POA: Diagnosis not present

## 2017-06-26 DIAGNOSIS — F419 Anxiety disorder, unspecified: Secondary | ICD-10-CM | POA: Diagnosis not present

## 2017-06-26 LAB — POCT URINALYSIS DIPSTICK
Bilirubin, UA: NEGATIVE
Blood, UA: NEGATIVE
GLUCOSE UA: NEGATIVE
Ketones, UA: NEGATIVE
LEUKOCYTES UA: NEGATIVE
NITRITE UA: NEGATIVE
PROTEIN UA: NEGATIVE
Spec Grav, UA: 1.025 (ref 1.010–1.025)
UROBILINOGEN UA: 0.2 U/dL
pH, UA: 6 (ref 5.0–8.0)
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
## 2017-07-15 ENCOUNTER — Other Ambulatory Visit: Payer: Self-pay | Admitting: Internal Medicine

## 2017-07-15 DIAGNOSIS — R7989 Other specified abnormal findings of blood chemistry: Secondary | ICD-10-CM

## 2017-07-20 ENCOUNTER — Other Ambulatory Visit: Payer: Medicare Other | Admitting: Internal Medicine

## 2017-07-22 ENCOUNTER — Ambulatory Visit: Payer: Medicare Other | Admitting: Internal Medicine

## 2017-08-02 ENCOUNTER — Other Ambulatory Visit: Payer: Self-pay | Admitting: Internal Medicine

## 2017-08-05 ENCOUNTER — Other Ambulatory Visit: Payer: Medicare Other | Admitting: Internal Medicine

## 2017-08-05 DIAGNOSIS — R7989 Other specified abnormal findings of blood chemistry: Secondary | ICD-10-CM | POA: Diagnosis not present

## 2017-08-05 LAB — BASIC METABOLIC PANEL
BUN/Creatinine Ratio: 18 (calc) (ref 6–22)
BUN: 18 mg/dL (ref 7–25)
CO2: 28 mmol/L (ref 20–32)
CREATININE: 1.01 mg/dL — AB (ref 0.60–0.93)
Calcium: 9.2 mg/dL (ref 8.6–10.4)
Chloride: 102 mmol/L (ref 98–110)
Glucose, Bld: 128 mg/dL — ABNORMAL HIGH (ref 65–99)
POTASSIUM: 5 mmol/L (ref 3.5–5.3)
SODIUM: 138 mmol/L (ref 135–146)

## 2017-08-05 NOTE — Addendum Note (Signed)
Addended by: Mady Haagensen on: 08/05/2017 09:24 AM   Modules accepted: Orders

## 2017-08-08 NOTE — Patient Instructions (Signed)
Stop losartan and return in late December for follow-up due to elevated serum creatinine.  Continue other medications as previously prescribed

## 2017-08-10 ENCOUNTER — Ambulatory Visit: Payer: Medicare Other | Admitting: Internal Medicine

## 2017-08-12 ENCOUNTER — Ambulatory Visit: Payer: Medicare Other | Admitting: Internal Medicine

## 2017-08-12 DIAGNOSIS — R7989 Other specified abnormal findings of blood chemistry: Secondary | ICD-10-CM

## 2017-08-24 DIAGNOSIS — M25551 Pain in right hip: Secondary | ICD-10-CM | POA: Diagnosis not present

## 2017-08-24 DIAGNOSIS — M7061 Trochanteric bursitis, right hip: Secondary | ICD-10-CM | POA: Diagnosis not present

## 2017-08-24 DIAGNOSIS — M25552 Pain in left hip: Secondary | ICD-10-CM | POA: Diagnosis not present

## 2017-08-29 ENCOUNTER — Encounter: Payer: Self-pay | Admitting: Internal Medicine

## 2017-08-29 NOTE — Patient Instructions (Signed)
Patient will continue to monitor blood pressure off losartan at home.  Creatinine is now normal off losartan.

## 2017-08-29 NOTE — Progress Notes (Signed)
   Subjective:    Patient ID: [name] [name], female    DOB: Jul 16, 1946, 73 y.o.   MRN: [id number]  HPI In today to follow-up on elevated serum creatinine.  She was here in November and blood pressure was 132/70.  Her creatinine was 1.16 fasting and previously a year ago had been 1.04.  We discontinued her losartan and she is here today for follow-up.  Nonfasting be met today shows creatinine of 1.01.  Her blood pressure is excellent.  She has been monitoring it at home and it is excellent as well off losartan.    Review of Systems feels well with no new complaints.  History of hypothyroidism on thyroid replacement therapy and normal TSH in November     Objective:   Physical Exam No thyromegaly.  No carotid bruits.  Chest clear.  Cardiac exam regular rate and rhythm normal S1 and S2.  Extremities without edema.       Assessment & Plan:  Elevated serum creatinine-now normal off losartan blood pressure is stable.  She will continue to monitor blood pressure  at home.  Hypothyroidism-on thyroid replacement therapy  Blood pressure is normal off losartan and we will continue to monitor.

## 2017-09-02 DIAGNOSIS — M7062 Trochanteric bursitis, left hip: Secondary | ICD-10-CM | POA: Diagnosis not present

## 2017-09-02 DIAGNOSIS — M7061 Trochanteric bursitis, right hip: Secondary | ICD-10-CM | POA: Diagnosis not present

## 2017-09-07 DIAGNOSIS — L570 Actinic keratosis: Secondary | ICD-10-CM | POA: Diagnosis not present

## 2017-09-07 DIAGNOSIS — D225 Melanocytic nevi of trunk: Secondary | ICD-10-CM | POA: Diagnosis not present

## 2017-09-07 DIAGNOSIS — C44319 Basal cell carcinoma of skin of other parts of face: Secondary | ICD-10-CM | POA: Diagnosis not present

## 2017-09-07 DIAGNOSIS — L738 Other specified follicular disorders: Secondary | ICD-10-CM | POA: Diagnosis not present

## 2017-09-07 DIAGNOSIS — L720 Epidermal cyst: Secondary | ICD-10-CM | POA: Diagnosis not present

## 2017-09-07 DIAGNOSIS — L821 Other seborrheic keratosis: Secondary | ICD-10-CM | POA: Diagnosis not present

## 2017-09-07 DIAGNOSIS — D485 Neoplasm of uncertain behavior of skin: Secondary | ICD-10-CM | POA: Diagnosis not present

## 2017-09-07 DIAGNOSIS — Z85828 Personal history of other malignant neoplasm of skin: Secondary | ICD-10-CM | POA: Diagnosis not present

## 2017-09-07 DIAGNOSIS — D1801 Hemangioma of skin and subcutaneous tissue: Secondary | ICD-10-CM | POA: Diagnosis not present

## 2017-09-09 DIAGNOSIS — M7062 Trochanteric bursitis, left hip: Secondary | ICD-10-CM | POA: Diagnosis not present

## 2017-09-09 DIAGNOSIS — M7061 Trochanteric bursitis, right hip: Secondary | ICD-10-CM | POA: Diagnosis not present

## 2017-09-11 DIAGNOSIS — M7061 Trochanteric bursitis, right hip: Secondary | ICD-10-CM | POA: Diagnosis not present

## 2017-09-11 DIAGNOSIS — M7062 Trochanteric bursitis, left hip: Secondary | ICD-10-CM | POA: Diagnosis not present

## 2017-09-16 DIAGNOSIS — M7061 Trochanteric bursitis, right hip: Secondary | ICD-10-CM | POA: Diagnosis not present

## 2017-09-16 DIAGNOSIS — M7062 Trochanteric bursitis, left hip: Secondary | ICD-10-CM | POA: Diagnosis not present

## 2017-09-17 ENCOUNTER — Other Ambulatory Visit: Payer: Self-pay | Admitting: Internal Medicine

## 2017-09-21 ENCOUNTER — Encounter: Payer: Self-pay | Admitting: Internal Medicine

## 2017-09-21 DIAGNOSIS — Z1231 Encounter for screening mammogram for malignant neoplasm of breast: Secondary | ICD-10-CM | POA: Diagnosis not present

## 2017-09-23 DIAGNOSIS — M7062 Trochanteric bursitis, left hip: Secondary | ICD-10-CM | POA: Diagnosis not present

## 2017-09-23 DIAGNOSIS — M7061 Trochanteric bursitis, right hip: Secondary | ICD-10-CM | POA: Diagnosis not present

## 2017-09-25 DIAGNOSIS — M25552 Pain in left hip: Secondary | ICD-10-CM | POA: Diagnosis not present

## 2017-09-25 DIAGNOSIS — M7062 Trochanteric bursitis, left hip: Secondary | ICD-10-CM | POA: Diagnosis not present

## 2017-09-30 DIAGNOSIS — M7062 Trochanteric bursitis, left hip: Secondary | ICD-10-CM | POA: Diagnosis not present

## 2017-09-30 DIAGNOSIS — M7061 Trochanteric bursitis, right hip: Secondary | ICD-10-CM | POA: Diagnosis not present

## 2017-10-02 DIAGNOSIS — M7061 Trochanteric bursitis, right hip: Secondary | ICD-10-CM | POA: Diagnosis not present

## 2017-10-02 DIAGNOSIS — M7062 Trochanteric bursitis, left hip: Secondary | ICD-10-CM | POA: Diagnosis not present

## 2017-10-07 DIAGNOSIS — C44319 Basal cell carcinoma of skin of other parts of face: Secondary | ICD-10-CM | POA: Diagnosis not present

## 2017-10-07 DIAGNOSIS — Z85828 Personal history of other malignant neoplasm of skin: Secondary | ICD-10-CM | POA: Diagnosis not present

## 2017-11-18 DIAGNOSIS — K6389 Other specified diseases of intestine: Secondary | ICD-10-CM | POA: Diagnosis not present

## 2017-11-18 DIAGNOSIS — K5909 Other constipation: Secondary | ICD-10-CM | POA: Diagnosis not present

## 2017-11-18 DIAGNOSIS — Z79899 Other long term (current) drug therapy: Secondary | ICD-10-CM | POA: Diagnosis not present

## 2017-11-18 DIAGNOSIS — R140 Abdominal distension (gaseous): Secondary | ICD-10-CM | POA: Diagnosis not present

## 2017-12-04 DIAGNOSIS — M25552 Pain in left hip: Secondary | ICD-10-CM | POA: Diagnosis not present

## 2018-01-13 DIAGNOSIS — M25551 Pain in right hip: Secondary | ICD-10-CM | POA: Diagnosis not present

## 2018-01-13 DIAGNOSIS — Z85828 Personal history of other malignant neoplasm of skin: Secondary | ICD-10-CM | POA: Diagnosis not present

## 2018-01-13 DIAGNOSIS — L570 Actinic keratosis: Secondary | ICD-10-CM | POA: Diagnosis not present

## 2018-01-13 DIAGNOSIS — L820 Inflamed seborrheic keratosis: Secondary | ICD-10-CM | POA: Diagnosis not present

## 2018-01-13 DIAGNOSIS — M25552 Pain in left hip: Secondary | ICD-10-CM | POA: Diagnosis not present

## 2018-01-13 DIAGNOSIS — D1801 Hemangioma of skin and subcutaneous tissue: Secondary | ICD-10-CM | POA: Diagnosis not present

## 2018-01-13 DIAGNOSIS — M7061 Trochanteric bursitis, right hip: Secondary | ICD-10-CM | POA: Diagnosis not present

## 2018-01-13 DIAGNOSIS — M7062 Trochanteric bursitis, left hip: Secondary | ICD-10-CM | POA: Diagnosis not present

## 2018-01-27 DIAGNOSIS — H1045 Other chronic allergic conjunctivitis: Secondary | ICD-10-CM | POA: Diagnosis not present

## 2018-01-27 DIAGNOSIS — J3089 Other allergic rhinitis: Secondary | ICD-10-CM | POA: Diagnosis not present

## 2018-01-27 DIAGNOSIS — R05 Cough: Secondary | ICD-10-CM | POA: Diagnosis not present

## 2018-01-27 DIAGNOSIS — K219 Gastro-esophageal reflux disease without esophagitis: Secondary | ICD-10-CM | POA: Diagnosis not present

## 2018-01-28 DIAGNOSIS — H26492 Other secondary cataract, left eye: Secondary | ICD-10-CM | POA: Diagnosis not present

## 2018-01-28 DIAGNOSIS — H3540 Unspecified peripheral retinal degeneration: Secondary | ICD-10-CM | POA: Diagnosis not present

## 2018-01-28 DIAGNOSIS — H52223 Regular astigmatism, bilateral: Secondary | ICD-10-CM | POA: Diagnosis not present

## 2018-01-28 DIAGNOSIS — H5203 Hypermetropia, bilateral: Secondary | ICD-10-CM | POA: Diagnosis not present

## 2018-01-28 DIAGNOSIS — Z961 Presence of intraocular lens: Secondary | ICD-10-CM | POA: Diagnosis not present

## 2018-02-01 DIAGNOSIS — M25552 Pain in left hip: Secondary | ICD-10-CM | POA: Diagnosis not present

## 2018-02-01 DIAGNOSIS — M25551 Pain in right hip: Secondary | ICD-10-CM | POA: Diagnosis not present

## 2018-02-01 DIAGNOSIS — M7061 Trochanteric bursitis, right hip: Secondary | ICD-10-CM | POA: Diagnosis not present

## 2018-02-01 DIAGNOSIS — M7062 Trochanteric bursitis, left hip: Secondary | ICD-10-CM | POA: Diagnosis not present

## 2018-03-22 ENCOUNTER — Other Ambulatory Visit: Payer: Self-pay | Admitting: Internal Medicine

## 2018-04-20 DIAGNOSIS — H02831 Dermatochalasis of right upper eyelid: Secondary | ICD-10-CM | POA: Diagnosis not present

## 2018-04-20 DIAGNOSIS — H26491 Other secondary cataract, right eye: Secondary | ICD-10-CM | POA: Diagnosis not present

## 2018-04-20 DIAGNOSIS — H26492 Other secondary cataract, left eye: Secondary | ICD-10-CM | POA: Diagnosis not present

## 2018-04-20 DIAGNOSIS — Z961 Presence of intraocular lens: Secondary | ICD-10-CM | POA: Diagnosis not present

## 2018-04-27 DIAGNOSIS — Z961 Presence of intraocular lens: Secondary | ICD-10-CM | POA: Diagnosis not present

## 2018-05-11 DIAGNOSIS — H26491 Other secondary cataract, right eye: Secondary | ICD-10-CM | POA: Diagnosis not present

## 2018-05-25 DIAGNOSIS — Z9849 Cataract extraction status, unspecified eye: Secondary | ICD-10-CM | POA: Diagnosis not present

## 2018-05-25 DIAGNOSIS — H52223 Regular astigmatism, bilateral: Secondary | ICD-10-CM | POA: Diagnosis not present

## 2018-05-25 DIAGNOSIS — H524 Presbyopia: Secondary | ICD-10-CM | POA: Diagnosis not present

## 2018-05-25 DIAGNOSIS — H5203 Hypermetropia, bilateral: Secondary | ICD-10-CM | POA: Diagnosis not present

## 2018-06-01 ENCOUNTER — Encounter: Payer: Self-pay | Admitting: Internal Medicine

## 2018-06-01 ENCOUNTER — Ambulatory Visit (INDEPENDENT_AMBULATORY_CARE_PROVIDER_SITE_OTHER): Payer: Medicare Other | Admitting: Internal Medicine

## 2018-06-01 VITALS — BP 136/78 | HR 102 | Temp 98.0°F | Ht 63.5 in | Wt 129.0 lb

## 2018-06-01 DIAGNOSIS — J0100 Acute maxillary sinusitis, unspecified: Secondary | ICD-10-CM

## 2018-06-01 MED ORDER — AZITHROMYCIN 250 MG PO TABS: ORAL_TABLET | ORAL | 0 refills | Status: DC

## 2018-06-01 MED ORDER — METHYLPREDNISOLONE ACETATE 80 MG/ML IJ SUSP
80.0000 mg | Freq: Once | INTRAMUSCULAR | Status: AC
  Administered 2018-06-01: 80 mg via INTRAMUSCULAR

## 2018-06-09 NOTE — Progress Notes (Signed)
   Subjective:    Patient ID: [name] [name], female    DOB: 1946/01/30, 72 y.o.   MRN: [id number]  HPI Patient has sore throat, nasal congestion, hoarseness malaise and fatigue.  She is getting baptized this weekend and wants to be well by then.  No fever or chills.  Not much cough.    Review of Systems see above     Objective:   Physical Exam  Skin warm and dry.  Nodes none.  Pharynx very slightly injected without exudate.  TMs are clear bilaterally.  She sounds nasally congested.  Neck supple.  Chest clear.  Maxillary sinus tenderness bilaterally      Assessment & Plan:  Acute maxillary sinusitis  Plan: Zithromax Z-PAK take 2 tablets day 1 followed by 1 tablet days 2 through 5.  Rest and drink plenty of fluids.  May continue over-the-counter decongestant/antihistamine if necessary  Blood pressure 136/78, pulse 102, temperature 98 degrees orally.  Pulse oximetry 98%.  15 minutes spent with patient.  Talked with her about her concerns and indicated to her she may not be completely better by the time she has bad times this coming Sunday.  Importance of rest and drinking fluids.

## 2018-06-09 NOTE — Patient Instructions (Signed)
Take Zithromax Z-PAK as directed.  Rest and drink plenty of fluids. 

## 2018-06-16 DIAGNOSIS — M79671 Pain in right foot: Secondary | ICD-10-CM | POA: Diagnosis not present

## 2018-06-25 ENCOUNTER — Other Ambulatory Visit: Payer: Self-pay | Admitting: Internal Medicine

## 2018-06-25 ENCOUNTER — Other Ambulatory Visit: Payer: Self-pay

## 2018-06-28 ENCOUNTER — Other Ambulatory Visit: Payer: Medicare Other | Admitting: Internal Medicine

## 2018-06-29 ENCOUNTER — Encounter: Payer: Medicare Other | Admitting: Internal Medicine

## 2018-06-30 ENCOUNTER — Encounter: Payer: Self-pay | Admitting: Internal Medicine

## 2018-06-30 DIAGNOSIS — K5190 Ulcerative colitis, unspecified, without complications: Secondary | ICD-10-CM | POA: Diagnosis not present

## 2018-06-30 DIAGNOSIS — Z8262 Family history of osteoporosis: Secondary | ICD-10-CM | POA: Diagnosis not present

## 2018-06-30 DIAGNOSIS — M8589 Other specified disorders of bone density and structure, multiple sites: Secondary | ICD-10-CM | POA: Diagnosis not present

## 2018-07-05 ENCOUNTER — Telehealth: Payer: Self-pay | Admitting: Internal Medicine

## 2018-07-05 NOTE — Telephone Encounter (Signed)
Patient calls this morning stating that she has had diarrhea 6 times already this morning.  She wants to know if she needs to be seen.  Advised that there is a GI virus going around.  She should change her diet to clear liquids, stay away from all milk based products.  She can do clear broths, gator-aid, etc.  She is to do this for 24 hours and call us back tomorrow afternoon to let us know if the amount of the diarrhea has slowed down.  We will gauge from there if she needs to be seen on Wednesday morning for diarrhea or if this is in fact the virus.    Patient verbalized understanding of these instructions.

## 2018-07-07 ENCOUNTER — Other Ambulatory Visit: Payer: Self-pay | Admitting: Internal Medicine

## 2018-07-07 DIAGNOSIS — K589 Irritable bowel syndrome without diarrhea: Secondary | ICD-10-CM

## 2018-07-07 DIAGNOSIS — F419 Anxiety disorder, unspecified: Secondary | ICD-10-CM

## 2018-07-07 DIAGNOSIS — M8580 Other specified disorders of bone density and structure, unspecified site: Secondary | ICD-10-CM

## 2018-07-07 DIAGNOSIS — I10 Essential (primary) hypertension: Secondary | ICD-10-CM

## 2018-07-07 DIAGNOSIS — K219 Gastro-esophageal reflux disease without esophagitis: Secondary | ICD-10-CM

## 2018-07-07 DIAGNOSIS — E781 Pure hyperglyceridemia: Secondary | ICD-10-CM

## 2018-07-07 DIAGNOSIS — Z0000 Encounter for general adult medical examination without abnormal findings: Secondary | ICD-10-CM

## 2018-07-07 DIAGNOSIS — F3170 Bipolar disorder, currently in remission, most recent episode unspecified: Secondary | ICD-10-CM

## 2018-07-13 ENCOUNTER — Other Ambulatory Visit: Payer: Medicare Other | Admitting: Internal Medicine

## 2018-07-13 ENCOUNTER — Other Ambulatory Visit: Payer: Self-pay | Admitting: Internal Medicine

## 2018-07-13 DIAGNOSIS — F3170 Bipolar disorder, currently in remission, most recent episode unspecified: Secondary | ICD-10-CM

## 2018-07-13 DIAGNOSIS — K219 Gastro-esophageal reflux disease without esophagitis: Secondary | ICD-10-CM

## 2018-07-13 DIAGNOSIS — Z0000 Encounter for general adult medical examination without abnormal findings: Secondary | ICD-10-CM | POA: Diagnosis not present

## 2018-07-13 DIAGNOSIS — I10 Essential (primary) hypertension: Secondary | ICD-10-CM | POA: Diagnosis not present

## 2018-07-13 DIAGNOSIS — E781 Pure hyperglyceridemia: Secondary | ICD-10-CM

## 2018-07-13 DIAGNOSIS — F419 Anxiety disorder, unspecified: Secondary | ICD-10-CM

## 2018-07-13 DIAGNOSIS — K589 Irritable bowel syndrome without diarrhea: Secondary | ICD-10-CM

## 2018-07-13 DIAGNOSIS — M8580 Other specified disorders of bone density and structure, unspecified site: Secondary | ICD-10-CM

## 2018-07-13 MED ORDER — IBANDRONATE SODIUM 150 MG PO TABS: 150.0000 mg | ORAL_TABLET | ORAL | 6 refills | Status: DC

## 2018-07-14 LAB — COMPLETE METABOLIC PANEL WITH GFR
AG RATIO: 1.7 (calc) (ref 1.0–2.5)
ALBUMIN MSPROF: 4.1 g/dL (ref 3.6–5.1)
ALT: 22 U/L (ref 6–29)
AST: 24 U/L (ref 10–35)
Alkaline phosphatase (APISO): 69 U/L (ref 33–130)
BILIRUBIN TOTAL: 0.4 mg/dL (ref 0.2–1.2)
BUN / CREAT RATIO: 21 (calc) (ref 6–22)
BUN: 22 mg/dL (ref 7–25)
CO2: 28 mmol/L (ref 20–32)
Calcium: 9.4 mg/dL (ref 8.6–10.4)
Chloride: 105 mmol/L (ref 98–110)
Creat: 1.04 mg/dL — ABNORMAL HIGH (ref 0.60–0.93)
GFR, Est African American: 62 mL/min/{1.73_m2} (ref >=60)
GFR, Est Non African American: 53 mL/min/{1.73_m2} — ABNORMAL LOW (ref >=60)
Globulin: 2.4 g/dL (calc) (ref 1.9–3.7)
Glucose, Bld: 92 mg/dL (ref 65–99)
POTASSIUM: 4.2 mmol/L (ref 3.5–5.3)
SODIUM: 141 mmol/L (ref 135–146)
TOTAL PROTEIN: 6.5 g/dL (ref 6.1–8.1)

## 2018-07-14 LAB — CBC WITH DIFFERENTIAL/PLATELET
BASOS ABS: 29 {cells}/uL (ref 0–200)
Basophils Relative: 0.6 %
EOS PCT: 2.9 %
Eosinophils Absolute: 139 cells/uL (ref 15–500)
HCT: 38.6 % (ref 35.0–45.0)
Hemoglobin: 13.3 g/dL (ref 11.7–15.5)
Lymphs Abs: 1622 cells/uL (ref 850–3900)
MCH: 30.9 pg (ref 27.0–33.0)
MCHC: 34.5 g/dL (ref 32.0–36.0)
MCV: 89.6 fL (ref 80.0–100.0)
MONOS PCT: 7.7 %
MPV: 9.2 fL (ref 7.5–12.5)
NEUTROS PCT: 55 %
Neutro Abs: 2640 cells/uL (ref 1500–7800)
Platelets: 222 10*3/uL (ref 140–400)
RBC: 4.31 10*6/uL (ref 3.80–5.10)
RDW: 12.1 % (ref 11.0–15.0)
Total Lymphocyte: 33.8 %
WBC mixed population: 370 cells/uL (ref 200–950)
WBC: 4.8 10*3/uL (ref 3.8–10.8)

## 2018-07-14 LAB — LIPID PANEL
Cholesterol: 153 mg/dL (ref <200)
HDL: 51 mg/dL (ref >50)
LDL Cholesterol (Calc): 79 mg/dL (calc)
NON-HDL CHOLESTEROL (CALC): 102 mg/dL (ref <130)
TRIGLYCERIDES: 126 mg/dL (ref <150)
Total CHOL/HDL Ratio: 3 (calc) (ref <5.0)

## 2018-07-14 LAB — TSH: TSH: 2.21 m[IU]/L (ref 0.40–4.50)

## 2018-07-15 ENCOUNTER — Encounter: Payer: Self-pay | Admitting: Internal Medicine

## 2018-07-15 ENCOUNTER — Ambulatory Visit (INDEPENDENT_AMBULATORY_CARE_PROVIDER_SITE_OTHER): Payer: Medicare Other | Admitting: Internal Medicine

## 2018-07-15 VITALS — BP 102/60 | HR 87 | Ht 63.5 in | Wt 129.0 lb

## 2018-07-15 DIAGNOSIS — Z0000 Encounter for general adult medical examination without abnormal findings: Secondary | ICD-10-CM

## 2018-07-15 DIAGNOSIS — E039 Hypothyroidism, unspecified: Secondary | ICD-10-CM | POA: Diagnosis not present

## 2018-07-15 DIAGNOSIS — E781 Pure hyperglyceridemia: Secondary | ICD-10-CM

## 2018-07-15 DIAGNOSIS — K219 Gastro-esophageal reflux disease without esophagitis: Secondary | ICD-10-CM | POA: Diagnosis not present

## 2018-07-15 DIAGNOSIS — K589 Irritable bowel syndrome without diarrhea: Secondary | ICD-10-CM | POA: Diagnosis not present

## 2018-07-15 DIAGNOSIS — K5900 Constipation, unspecified: Secondary | ICD-10-CM

## 2018-07-15 DIAGNOSIS — Z23 Encounter for immunization: Secondary | ICD-10-CM

## 2018-07-15 DIAGNOSIS — I10 Essential (primary) hypertension: Secondary | ICD-10-CM

## 2018-07-15 DIAGNOSIS — M8580 Other specified disorders of bone density and structure, unspecified site: Secondary | ICD-10-CM

## 2018-07-15 DIAGNOSIS — N9089 Other specified noninflammatory disorders of vulva and perineum: Secondary | ICD-10-CM

## 2018-07-15 DIAGNOSIS — F3170 Bipolar disorder, currently in remission, most recent episode unspecified: Secondary | ICD-10-CM | POA: Diagnosis not present

## 2018-07-15 MED ORDER — CLOTRIMAZOLE-BETAMETHASONE 1-0.05 % EX CREA: 1.0000 "application " | TOPICAL_CREAM | Freq: Two times a day (BID) | CUTANEOUS | 0 refills | Status: DC
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
## 2018-07-15 NOTE — Patient Instructions (Addendum)
Stay with Boniva .  Need bone density study soon.  Have mammogram in February. Flu vaccine given. Lotrisone  To labia twice a day prn irritation.

## 2018-08-16 DIAGNOSIS — L738 Other specified follicular disorders: Secondary | ICD-10-CM | POA: Diagnosis not present

## 2018-08-16 DIAGNOSIS — Z85828 Personal history of other malignant neoplasm of skin: Secondary | ICD-10-CM | POA: Diagnosis not present

## 2018-08-16 DIAGNOSIS — L720 Epidermal cyst: Secondary | ICD-10-CM | POA: Diagnosis not present

## 2018-09-06 ENCOUNTER — Other Ambulatory Visit: Payer: Self-pay | Admitting: Internal Medicine

## 2018-10-05 DIAGNOSIS — Z1231 Encounter for screening mammogram for malignant neoplasm of breast: Secondary | ICD-10-CM | POA: Diagnosis not present

## 2018-10-05 LAB — HM MAMMOGRAPHY

## 2018-10-07 ENCOUNTER — Encounter: Payer: Self-pay | Admitting: Internal Medicine

## 2018-10-12 DIAGNOSIS — D2272 Melanocytic nevi of left lower limb, including hip: Secondary | ICD-10-CM | POA: Diagnosis not present

## 2018-10-12 DIAGNOSIS — L738 Other specified follicular disorders: Secondary | ICD-10-CM | POA: Diagnosis not present

## 2018-10-12 DIAGNOSIS — Z85828 Personal history of other malignant neoplasm of skin: Secondary | ICD-10-CM | POA: Diagnosis not present

## 2018-10-12 DIAGNOSIS — D2271 Melanocytic nevi of right lower limb, including hip: Secondary | ICD-10-CM | POA: Diagnosis not present

## 2018-10-12 DIAGNOSIS — L821 Other seborrheic keratosis: Secondary | ICD-10-CM | POA: Diagnosis not present

## 2018-10-12 DIAGNOSIS — D2261 Melanocytic nevi of right upper limb, including shoulder: Secondary | ICD-10-CM | POA: Diagnosis not present

## 2018-10-12 DIAGNOSIS — D1801 Hemangioma of skin and subcutaneous tissue: Secondary | ICD-10-CM | POA: Diagnosis not present

## 2018-10-26 ENCOUNTER — Encounter: Payer: Self-pay | Admitting: Internal Medicine

## 2018-11-08 DIAGNOSIS — F331 Major depressive disorder, recurrent, moderate: Secondary | ICD-10-CM | POA: Diagnosis not present

## 2018-11-17 ENCOUNTER — Other Ambulatory Visit: Payer: Self-pay | Admitting: Internal Medicine

## 2018-12-09 ENCOUNTER — Other Ambulatory Visit: Payer: Self-pay

## 2018-12-09 MED ORDER — IBANDRONATE SODIUM 150 MG PO TABS: 150.0000 mg | ORAL_TABLET | ORAL | 3 refills | Status: DC

## 2018-12-09 MED ORDER — IBANDRONATE SODIUM 150 MG PO TABS: 150.0000 mg | ORAL_TABLET | ORAL | 12 refills | Status: DC

## 2019-01-27 DIAGNOSIS — E039 Hypothyroidism, unspecified: Secondary | ICD-10-CM | POA: Diagnosis not present

## 2019-01-27 DIAGNOSIS — K5909 Other constipation: Secondary | ICD-10-CM | POA: Diagnosis not present

## 2019-01-27 DIAGNOSIS — K219 Gastro-esophageal reflux disease without esophagitis: Secondary | ICD-10-CM | POA: Diagnosis not present

## 2019-02-01 ENCOUNTER — Telehealth: Payer: Self-pay | Admitting: Internal Medicine

## 2019-02-01 NOTE — Telephone Encounter (Signed)
[name] [name] [phone]  [name] called to see if she could get the Cologaurd test, her gastrologist Dr Nyoka Cowden suggested she call her PCP for this since she is unable to come in at this time for a colonoscopy.

## 2019-02-01 NOTE — Telephone Encounter (Signed)
Needs OV to arrange

## 2019-02-01 NOTE — Telephone Encounter (Signed)
Scheduled appointment

## 2019-02-22 ENCOUNTER — Other Ambulatory Visit: Payer: Self-pay | Admitting: Internal Medicine

## 2019-02-25 ENCOUNTER — Other Ambulatory Visit: Payer: Self-pay

## 2019-02-25 ENCOUNTER — Ambulatory Visit (INDEPENDENT_AMBULATORY_CARE_PROVIDER_SITE_OTHER): Payer: Medicare Other | Admitting: Internal Medicine

## 2019-02-25 VITALS — BP 110/80 | HR 106 | Wt 129.0 lb

## 2019-02-25 DIAGNOSIS — K5904 Chronic idiopathic constipation: Secondary | ICD-10-CM | POA: Diagnosis not present

## 2019-02-25 DIAGNOSIS — Z86010 Personal history of colonic polyps: Secondary | ICD-10-CM | POA: Diagnosis not present

## 2019-03-01 ENCOUNTER — Ambulatory Visit (INDEPENDENT_AMBULATORY_CARE_PROVIDER_SITE_OTHER): Payer: Medicare Other | Admitting: Internal Medicine

## 2019-03-01 ENCOUNTER — Other Ambulatory Visit: Payer: Self-pay

## 2019-03-01 ENCOUNTER — Encounter: Payer: Self-pay | Admitting: Internal Medicine

## 2019-03-01 ENCOUNTER — Telehealth: Payer: Self-pay

## 2019-03-01 VITALS — BP 160/90 | HR 101 | Ht 63.5 in | Wt 129.0 lb

## 2019-03-01 DIAGNOSIS — N898 Other specified noninflammatory disorders of vagina: Secondary | ICD-10-CM

## 2019-03-01 MED ORDER — HYDROCORTISONE 1 % EX OINT: 1.0000 "application " | TOPICAL_OINTMENT | Freq: Two times a day (BID) | CUTANEOUS | 0 refills | Status: DC

## 2019-03-01 MED ORDER — VALACYCLOVIR HCL 500 MG PO TABS: 500.0000 mg | ORAL_TABLET | Freq: Two times a day (BID) | ORAL | 2 refills | Status: DC

## 2019-03-01 NOTE — Telephone Encounter (Signed)
Patient called states the vaginal cream LOTRISONE is not working for her now she has an open area on her vaginal area not a bump but she said it feels like an opened area and it is sore and she is requesting something else for it.

## 2019-03-01 NOTE — Telephone Encounter (Signed)
Appt made

## 2019-03-01 NOTE — Telephone Encounter (Signed)
Please make her an appt so I can see it.

## 2019-03-03 ENCOUNTER — Telehealth: Payer: Self-pay | Admitting: Internal Medicine

## 2019-03-03 NOTE — Telephone Encounter (Signed)
Pt called and wouldn't give me any detail but just asked my to tell Dr Renold Genta that everything is ok and that she would understand

## 2019-03-03 NOTE — Telephone Encounter (Signed)
She has decided not to come in today as she is better

## 2019-03-04 LAB — HERPES CULTURE, RAPID

## 2019-03-04 LAB — EXTRA SPECIMEN

## 2019-03-05 ENCOUNTER — Encounter: Payer: Self-pay | Admitting: Internal Medicine

## 2019-03-05 NOTE — Progress Notes (Signed)
   Subjective:    Patient ID: [name] [name], female    DOB: 1946-07-14, 72 y.o.   MRN: [id number]  HPI 73 year old Female in today to discuss possible Cologuard screening.  She recently saw Dr. Nyoka Cowden, GI physician at [facility] regarding irritable bowel symptoms and chronic constipation.  He advised her to discuss Cologuard screening with me.  However we were able to find her colonoscopy report from 2014.  Dr. Sharlett Iles did that study.  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  2 specimens were sent to pathology and were found to be hyperplastic polyps.  10-year follow-up was recommended.  This report has been sent to Dr. Eduard Roux, gastroenterologist at [facility].  It would appear that patient does not need repeat colonoscopy until 2024 and Cologuard would be acceptable at that time if she does not want to undergo colonoscopy.  She will be 73 years old at that time.   Review of Systems see above-constipation now under good control     Objective:   Physical Exam  Not examined but spent 15 minutes with patient and researching reporting making recommendations      Assessment & Plan:  History of functional constipation  Colonoscopy not due until 2024 and may be okay just to have Cologuard at that point due to her age

## 2019-03-05 NOTE — Progress Notes (Signed)
   Subjective:    Patient ID: [name] [name], female    DOB: Nov 13, 1945, 73 y.o.   MRN: [id number]  HPI 73 year old Female with history of occasional vaginal irritation.  Lotrisone was prescribed December 2019 for complaint of labial irritation.  She called to say say Lotrisone is not working for her.  Office visit was advised.  Patient is concerned about possible genital herpes.  She is not currently not sexually active but thinks maybe her ex-husband had Herpes.  She is anxious today.  History of bipolar disorder.    Review of Systems see above     Objective:   Physical Exam Inspection of vaginal area reveals no ulcerated lesions consistent with herpes.  There is some erythema at the introitus at 6 o'clock       Assessment & Plan:  Vaginal irritation- we took culture for herpes simplex however, the collection continue was rejected by lab.  We prescribed in standard of Lotrisone hydrocortisone ointment 1% to use in the vaginal area.  We called patient the following day to see if she wanted to return to get herpes culture.  Says she was markedly improved and did not want to return.

## 2019-03-05 NOTE — Patient Instructions (Signed)
Colonoscopy report faxed to Dr. Eduard Roux, gastroenterologist at [facility].  Study was done in 2014 with hyperplastic polyps found.  10-year follow-up recommended at that time by Dr. Sharlett Iles.

## 2019-03-05 NOTE — Patient Instructions (Addendum)
Use hydrocortisone ointment up to 3 times daily at introitus.  Prescribed Valtrex 500 mg twice daily for 5 days pending culture results.

## 2019-03-15 ENCOUNTER — Telehealth: Payer: Self-pay | Admitting: Internal Medicine

## 2019-03-15 DIAGNOSIS — Z1211 Encounter for screening for malignant neoplasm of colon: Secondary | ICD-10-CM

## 2019-03-15 NOTE — Telephone Encounter (Signed)
Okay to order?

## 2019-03-15 NOTE — Telephone Encounter (Signed)
[name] [name] called to say she wants to move forward with doing the cologaurd.

## 2019-03-15 NOTE — Telephone Encounter (Signed)
OK to order 

## 2019-03-17 NOTE — Telephone Encounter (Signed)
Faxed

## 2019-03-21 ENCOUNTER — Other Ambulatory Visit: Payer: Self-pay | Admitting: Internal Medicine

## 2019-03-25 DIAGNOSIS — Z1211 Encounter for screening for malignant neoplasm of colon: Secondary | ICD-10-CM | POA: Diagnosis not present

## 2019-03-25 LAB — COLOGUARD

## 2019-03-28 ENCOUNTER — Encounter: Payer: Self-pay | Admitting: Internal Medicine

## 2019-03-28 ENCOUNTER — Telehealth: Payer: Self-pay

## 2019-03-28 ENCOUNTER — Other Ambulatory Visit: Payer: Self-pay

## 2019-03-28 ENCOUNTER — Ambulatory Visit (INDEPENDENT_AMBULATORY_CARE_PROVIDER_SITE_OTHER): Payer: Medicare Other | Admitting: Internal Medicine

## 2019-03-28 VITALS — BP 102/70 | HR 100 | Temp 98.3°F | Ht 63.5 in | Wt 127.0 lb

## 2019-03-28 DIAGNOSIS — N898 Other specified noninflammatory disorders of vagina: Secondary | ICD-10-CM | POA: Diagnosis not present

## 2019-03-28 DIAGNOSIS — N952 Postmenopausal atrophic vaginitis: Secondary | ICD-10-CM

## 2019-03-28 NOTE — Progress Notes (Signed)
   Subjective:    Patient ID: [name] [name], female    DOB: 05-05-1946, 73 y.o.   MRN: [id number]  HPI 73 year old Female seen July 21 with complaint of burning in labial area. Apparently was left labial area at that time not responding to Lotrisone cream so Lotrisone ointment was prescribed and rapidly improved.  Now says there is burning right labia. No ulcerations noted.Not responding to previous treatment.    Review of Systems see above     Objective:   Physical Exam  There is redness right inner labia without ulceration and is tender to touch. Attempted herpes culture but does not look like Herpes. There is some pallor in middle and surrounding area has telangetasias.      Assessment & Plan:  Possible Lichen sclerosus vs. Atrophic vaginitis  Plan: Refer to GYN- pt previously saw Dr. Gertie Fey.

## 2019-03-28 NOTE — Telephone Encounter (Signed)
OK 

## 2019-03-28 NOTE — Patient Instructions (Signed)
Referral to GYN 

## 2019-03-28 NOTE — Addendum Note (Signed)
Addended by: Mady Haagensen on: 03/28/2019 12:44 PM   Modules accepted: Orders

## 2019-03-28 NOTE — Telephone Encounter (Signed)
Patient called states her vaginal area is red and it feels raw, it is painful when she wipes she said the bump that was there before has healed and this is something new. She would like to be seen is it okay to make an appt to be seen here?

## 2019-03-29 NOTE — Telephone Encounter (Signed)
Done

## 2019-03-29 NOTE — Telephone Encounter (Signed)
OK cancel referral

## 2019-03-29 NOTE — Telephone Encounter (Signed)
Lam called and wants to hold off on referral, she is getting better. She stated she thinks she may have used wrong cream (Retena) on it.

## 2019-04-01 LAB — HERPES CULTURE, RAPID
MICRO NUMBER:: 778548
SPECIMEN QUALITY:: ADEQUATE

## 2019-04-07 DIAGNOSIS — F331 Major depressive disorder, recurrent, moderate: Secondary | ICD-10-CM | POA: Diagnosis not present

## 2019-05-05 ENCOUNTER — Other Ambulatory Visit: Payer: Self-pay

## 2019-05-05 ENCOUNTER — Ambulatory Visit (INDEPENDENT_AMBULATORY_CARE_PROVIDER_SITE_OTHER): Payer: Medicare Other | Admitting: Internal Medicine

## 2019-05-05 DIAGNOSIS — Z23 Encounter for immunization: Secondary | ICD-10-CM | POA: Diagnosis not present

## 2019-05-05 NOTE — Patient Instructions (Signed)
Patient received a flu vaccine IM L deltoid, AV, CMA  

## 2019-05-05 NOTE — Progress Notes (Signed)
Flu vaccine by CMA 

## 2019-05-10 DIAGNOSIS — L820 Inflamed seborrheic keratosis: Secondary | ICD-10-CM | POA: Diagnosis not present

## 2019-05-10 DIAGNOSIS — Z85828 Personal history of other malignant neoplasm of skin: Secondary | ICD-10-CM | POA: Diagnosis not present

## 2019-05-10 DIAGNOSIS — D485 Neoplasm of uncertain behavior of skin: Secondary | ICD-10-CM | POA: Diagnosis not present

## 2019-05-19 DIAGNOSIS — H43393 Other vitreous opacities, bilateral: Secondary | ICD-10-CM | POA: Diagnosis not present

## 2019-05-19 DIAGNOSIS — H5203 Hypermetropia, bilateral: Secondary | ICD-10-CM | POA: Diagnosis not present

## 2019-05-19 DIAGNOSIS — Z961 Presence of intraocular lens: Secondary | ICD-10-CM | POA: Diagnosis not present

## 2019-05-19 DIAGNOSIS — H52223 Regular astigmatism, bilateral: Secondary | ICD-10-CM | POA: Diagnosis not present

## 2019-05-19 DIAGNOSIS — H524 Presbyopia: Secondary | ICD-10-CM | POA: Diagnosis not present

## 2019-05-19 DIAGNOSIS — Z9849 Cataract extraction status, unspecified eye: Secondary | ICD-10-CM | POA: Diagnosis not present

## 2019-06-13 ENCOUNTER — Other Ambulatory Visit: Payer: Self-pay | Admitting: Internal Medicine

## 2019-07-01 ENCOUNTER — Other Ambulatory Visit: Payer: Self-pay

## 2019-07-06 ENCOUNTER — Other Ambulatory Visit: Payer: Self-pay

## 2019-07-06 MED ORDER — SYNTHROID 25 MCG PO TABS: 25.0000 ug | ORAL_TABLET | Freq: Every day | ORAL | 1 refills | Status: DC

## 2019-07-08 ENCOUNTER — Other Ambulatory Visit: Payer: Self-pay | Admitting: Internal Medicine

## 2019-07-14 ENCOUNTER — Other Ambulatory Visit: Payer: Medicare Other | Admitting: Internal Medicine

## 2019-07-14 ENCOUNTER — Other Ambulatory Visit: Payer: Self-pay

## 2019-07-14 DIAGNOSIS — I10 Essential (primary) hypertension: Secondary | ICD-10-CM

## 2019-07-14 DIAGNOSIS — K219 Gastro-esophageal reflux disease without esophagitis: Secondary | ICD-10-CM | POA: Diagnosis not present

## 2019-07-14 DIAGNOSIS — F3170 Bipolar disorder, currently in remission, most recent episode unspecified: Secondary | ICD-10-CM

## 2019-07-14 DIAGNOSIS — M8580 Other specified disorders of bone density and structure, unspecified site: Secondary | ICD-10-CM

## 2019-07-14 DIAGNOSIS — E781 Pure hyperglyceridemia: Secondary | ICD-10-CM | POA: Diagnosis not present

## 2019-07-14 DIAGNOSIS — Z0000 Encounter for general adult medical examination without abnormal findings: Secondary | ICD-10-CM | POA: Diagnosis not present

## 2019-07-14 DIAGNOSIS — E039 Hypothyroidism, unspecified: Secondary | ICD-10-CM

## 2019-07-14 DIAGNOSIS — K589 Irritable bowel syndrome without diarrhea: Secondary | ICD-10-CM | POA: Diagnosis not present

## 2019-07-15 LAB — CBC WITH DIFFERENTIAL/PLATELET
Absolute Monocytes: 413 cells/uL (ref 200–950)
Basophils Absolute: 29 cells/uL (ref 0–200)
Basophils Relative: 0.6 %
Eosinophils Absolute: 101 cells/uL (ref 15–500)
Eosinophils Relative: 2.1 %
HCT: 42.7 % (ref 35.0–45.0)
Hemoglobin: 14.2 g/dL (ref 11.7–15.5)
Lymphs Abs: 2117 cells/uL (ref 850–3900)
MCH: 31 pg (ref 27.0–33.0)
MCHC: 33.3 g/dL (ref 32.0–36.0)
MCV: 93.2 fL (ref 80.0–100.0)
MPV: 9.5 fL (ref 7.5–12.5)
Monocytes Relative: 8.6 %
Neutro Abs: 2141 cells/uL (ref 1500–7800)
Neutrophils Relative %: 44.6 %
Platelets: 210 10*3/uL (ref 140–400)
RBC: 4.58 10*6/uL (ref 3.80–5.10)
RDW: 11.7 % (ref 11.0–15.0)
Total Lymphocyte: 44.1 %
WBC: 4.8 10*3/uL (ref 3.8–10.8)

## 2019-07-15 LAB — COMPLETE METABOLIC PANEL WITH GFR
AG Ratio: 1.8 (calc) (ref 1.0–2.5)
ALT: 19 U/L (ref 6–29)
AST: 23 U/L (ref 10–35)
Albumin: 4.4 g/dL (ref 3.6–5.1)
Alkaline phosphatase (APISO): 58 U/L (ref 37–153)
BUN/Creatinine Ratio: 17 (calc) (ref 6–22)
BUN: 23 mg/dL (ref 7–25)
CO2: 26 mmol/L (ref 20–32)
Calcium: 9.7 mg/dL (ref 8.6–10.4)
Chloride: 106 mmol/L (ref 98–110)
Creat: 1.35 mg/dL — ABNORMAL HIGH (ref 0.60–0.93)
GFR, Est African American: 45 mL/min/{1.73_m2} — ABNORMAL LOW (ref >=60)
GFR, Est Non African American: 39 mL/min/{1.73_m2} — ABNORMAL LOW (ref >=60)
Globulin: 2.5 g/dL (calc) (ref 1.9–3.7)
Glucose, Bld: 93 mg/dL (ref 65–99)
Potassium: 5.3 mmol/L (ref 3.5–5.3)
Sodium: 145 mmol/L (ref 135–146)
Total Bilirubin: 0.5 mg/dL (ref 0.2–1.2)
Total Protein: 6.9 g/dL (ref 6.1–8.1)

## 2019-07-15 LAB — LIPID PANEL
Cholesterol: 134 mg/dL (ref <200)
HDL: 51 mg/dL (ref >=50)
LDL Cholesterol (Calc): 60 mg/dL (calc)
Non-HDL Cholesterol (Calc): 83 mg/dL (calc) (ref <130)
Total CHOL/HDL Ratio: 2.6 (calc) (ref <5.0)
Triglycerides: 142 mg/dL (ref <150)

## 2019-07-15 LAB — TSH: TSH: 2.78 mIU/L (ref 0.40–4.50)

## 2019-07-18 ENCOUNTER — Encounter: Payer: Self-pay | Admitting: Internal Medicine

## 2019-07-18 ENCOUNTER — Ambulatory Visit (INDEPENDENT_AMBULATORY_CARE_PROVIDER_SITE_OTHER): Payer: Medicare Other | Admitting: Internal Medicine

## 2019-07-18 ENCOUNTER — Other Ambulatory Visit: Payer: Self-pay

## 2019-07-18 ENCOUNTER — Telehealth: Payer: Self-pay | Admitting: Internal Medicine

## 2019-07-18 VITALS — BP 140/80 | HR 105 | Temp 98.0°F | Ht 63.5 in | Wt 130.0 lb

## 2019-07-18 DIAGNOSIS — F3170 Bipolar disorder, currently in remission, most recent episode unspecified: Secondary | ICD-10-CM | POA: Diagnosis not present

## 2019-07-18 DIAGNOSIS — Z860100 Personal history of colon polyps, unspecified: Secondary | ICD-10-CM

## 2019-07-18 DIAGNOSIS — K589 Irritable bowel syndrome without diarrhea: Secondary | ICD-10-CM | POA: Diagnosis not present

## 2019-07-18 DIAGNOSIS — R7989 Other specified abnormal findings of blood chemistry: Secondary | ICD-10-CM

## 2019-07-18 DIAGNOSIS — Z0000 Encounter for general adult medical examination without abnormal findings: Secondary | ICD-10-CM

## 2019-07-18 DIAGNOSIS — K5904 Chronic idiopathic constipation: Secondary | ICD-10-CM | POA: Diagnosis not present

## 2019-07-18 DIAGNOSIS — E781 Pure hyperglyceridemia: Secondary | ICD-10-CM

## 2019-07-18 DIAGNOSIS — M8580 Other specified disorders of bone density and structure, unspecified site: Secondary | ICD-10-CM

## 2019-07-18 DIAGNOSIS — E039 Hypothyroidism, unspecified: Secondary | ICD-10-CM

## 2019-07-18 LAB — POCT URINALYSIS DIPSTICK
Appearance: NEGATIVE
Bilirubin, UA: NEGATIVE
Blood, UA: NEGATIVE
Glucose, UA: NEGATIVE
Ketones, UA: NEGATIVE
Leukocytes, UA: NEGATIVE
Nitrite, UA: NEGATIVE
Odor: NEGATIVE
Protein, UA: NEGATIVE
Spec Grav, UA: 1.01 (ref 1.010–1.025)
Urobilinogen, UA: 0.2 E.U./dL
pH, UA: 6.5 (ref 5.0–8.0)
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
## 2019-07-18 NOTE — Patient Instructions (Addendum)
Change Synthroid to levothyroxine. Has not chosen new pharmacy yet. Will let us know where she would like prescription called.  Needs follow-up on elevated serum creatinine.  Has appointment early January to discuss.  Needs to be repeated when she is well-hydrated.

## 2019-07-18 NOTE — Telephone Encounter (Signed)
[name] [name] called back to say she forgot to tell you that a week and half ago she started taking Doxepin 25 mg 1 at night to sleep prescribed by Dr Casimiro Needle. Could this be causing abnormal labs?

## 2019-07-19 ENCOUNTER — Other Ambulatory Visit: Payer: Medicare Other | Admitting: Internal Medicine

## 2019-07-19 DIAGNOSIS — R7989 Other specified abnormal findings of blood chemistry: Secondary | ICD-10-CM | POA: Diagnosis not present

## 2019-07-19 NOTE — Telephone Encounter (Signed)
I let patient know what Dr Renold Genta said and she verbalized understanding.

## 2019-07-19 NOTE — Telephone Encounter (Signed)
I do not think so. Once again she was fasting and likely a bit dehydrated. Repeat B-met when well hydrated

## 2019-07-20 LAB — BUN/CREATININE RATIO
BUN/Creatinine Ratio: 12 (calc) (ref 6–22)
BUN: 15 mg/dL (ref 7–25)
Creat: 1.22 mg/dL — ABNORMAL HIGH (ref 0.60–0.93)
GFR, Est African American: 51 mL/min/{1.73_m2} — ABNORMAL LOW (ref >=60)
GFR, Est Non African American: 44 mL/min/{1.73_m2} — ABNORMAL LOW (ref >=60)

## 2019-07-21 ENCOUNTER — Telehealth: Payer: Self-pay | Admitting: Internal Medicine

## 2019-07-21 ENCOUNTER — Other Ambulatory Visit: Payer: Self-pay

## 2019-07-21 ENCOUNTER — Encounter: Payer: Self-pay | Admitting: Internal Medicine

## 2019-07-21 ENCOUNTER — Ambulatory Visit (INDEPENDENT_AMBULATORY_CARE_PROVIDER_SITE_OTHER): Payer: Medicare Other | Admitting: Internal Medicine

## 2019-07-21 VITALS — Ht 63.5 in | Wt 130.0 lb

## 2019-07-21 DIAGNOSIS — R7989 Other specified abnormal findings of blood chemistry: Secondary | ICD-10-CM

## 2019-07-21 NOTE — Telephone Encounter (Signed)
Called patient to discuss elevated creatinine. See other note.

## 2019-07-21 NOTE — Progress Notes (Addendum)
Spoke with patient by telephone regarding persistently elevated creatinine.  Level is now 1.22 and on December 3 was 1.35.  She was well-hydrated at the time the creatinine was drawn on December 8 and result was 1.2 today.  In 2019 creatinine was 1.04 and in 2018 1.01.  She denies taking excessive NSAIDs.  She does not have hypertension.  Only new medication is doxepin 25 mg given to her by psychiatrist to help her sleep.  Indicated to her I would like for her to have renal ultrasound.  She wants to hold off on that and speak with her psychiatrist about stopping the doxepin and see if that will make a change in her elevated serum creatinine.  I am not sure that it will.  She will call and let me know if she goes off of Doxepin and if so we can repeat the creatinine in a few days afterwards.  However I still think we are going to be headed toward a chronic kidney disease work-up.  Due to the coronavirus pandemic we connected by phone call today.  Spent 5 to 10 minutes with her going over lab work, medications, interactions, medical decision making and follow-up.  Time also included reviewing lab work previously, medications and chart reviewed.

## 2019-07-29 DIAGNOSIS — K5909 Other constipation: Secondary | ICD-10-CM | POA: Diagnosis not present

## 2019-08-09 ENCOUNTER — Telehealth: Payer: Self-pay

## 2019-08-09 NOTE — Telephone Encounter (Signed)
Book  in person OV next week to discuss this. We have repeated creatinine once already.

## 2019-08-09 NOTE — Telephone Encounter (Signed)
Patient called her psychiatrist and was told that doxepin could have not caused her creatinine to go up. She wants to come in to it rechecked, okay to make lab appointment?   [phone].

## 2019-08-09 NOTE — Telephone Encounter (Signed)
Scheduled appointment

## 2019-08-19 ENCOUNTER — Encounter: Payer: Self-pay | Admitting: Internal Medicine

## 2019-08-19 ENCOUNTER — Other Ambulatory Visit: Payer: Self-pay

## 2019-08-19 ENCOUNTER — Ambulatory Visit (INDEPENDENT_AMBULATORY_CARE_PROVIDER_SITE_OTHER): Payer: Medicare Other | Admitting: Internal Medicine

## 2019-08-19 VITALS — BP 120/60 | HR 98 | Temp 98.0°F | Ht 63.5 in | Wt 132.0 lb

## 2019-08-19 DIAGNOSIS — R7989 Other specified abnormal findings of blood chemistry: Secondary | ICD-10-CM | POA: Diagnosis not present

## 2019-08-19 NOTE — Progress Notes (Signed)
   Subjective:    Patient ID: Milus Mallick, female    DOB: July 21, 1946, 74 y.o.   MRN: [id number]  HPI    74 year old Female being seen today for elevated serum creatinine. She is anxious about this.  Hx of hypothyroidism and hyperlipidemia. Hx of bipolar disorder.  Denies excessive use of analgesics such as NSAIDS.  Meds include Pamelor, Nexium, Neurontin, calcium 400 mg daily, Vitamin D, Boniva, Lipitor. Dr. Jetty Duhamel recently prescribed Doxepin.  Fasting creatinine is 1.35 and a year ago was 1.04.  This was fasting.  We repeated creatinine when she was well-hydrated and it is 1.22.  Patient says there is family history of brother with hypertension and kidney disorder.    Review of Systems no complaints     Objective:   Physical Exam BP stable at 120/60. Weight 132 pounds  she i seen today anxious but no acute distress.  Explained to her that I would like to pursue a work-up for Chronic disease with renal ultrasound and referral to [facility].      Assessment & Plan:  Elevated serum creatinine without proteinuria- may have Stage 3 a CKD  Plan: Referral to [facility] ultrasound. Patient reassured this is common and may just need to be followed.

## 2019-08-19 NOTE — Patient Instructions (Addendum)
Referral to [facility] and pt is to have renal ultrasound due to elevated serum creatinine.

## 2019-08-23 ENCOUNTER — Telehealth: Payer: Self-pay | Admitting: Internal Medicine

## 2019-08-23 NOTE — Telephone Encounter (Signed)
Adianna Whipp [phone]  [name] called inquiring about [place] that was scheduled. They called her to schedule appointment, she went ahead and scheduled, but was inquiring if you had ordered it. I let her know that you did and I had sent over the referral for [facility]. She said she did not realize she needed [place].

## 2019-08-23 NOTE — Telephone Encounter (Signed)
Called and let patient know that you guys had discussed at last appointment,

## 2019-08-23 NOTE — Telephone Encounter (Signed)
This was discussed with her at last visit.

## 2019-08-24 ENCOUNTER — Other Ambulatory Visit: Payer: Self-pay

## 2019-08-24 ENCOUNTER — Ambulatory Visit
Admission: RE | Admit: 2019-08-24 | Discharge: 2019-08-24 | Disposition: A | Discharge status: Home / Self Care | Payer: Medicare Other | Source: Ambulatory Visit | Attending: Internal Medicine | Admitting: Internal Medicine

## 2019-08-24 DIAGNOSIS — N2889 Other specified disorders of kidney and ureter: Secondary | ICD-10-CM | POA: Diagnosis not present

## 2019-08-24 DIAGNOSIS — R7989 Other specified abnormal findings of blood chemistry: Secondary | ICD-10-CM

## 2019-08-25 ENCOUNTER — Other Ambulatory Visit: Payer: Self-pay

## 2019-09-02 ENCOUNTER — Ambulatory Visit: Payer: Medicare Other | Attending: Internal Medicine

## 2019-09-02 DIAGNOSIS — Z23 Encounter for immunization: Secondary | ICD-10-CM | POA: Insufficient documentation

## 2019-09-02 NOTE — Progress Notes (Addendum)
   Covid-19 Vaccination Clinic  Name:  [name] [name]    MRN: [id number] DOB: 11/22/1945  09/02/2019  [name] was observed post Covid-19 immunization for 15 minutes without incidence. She was provided with Vaccine Information Sheet and instruction to access the V-Safe system.   [name] was instructed to call 911 with any severe reactions post vaccine: [name] Difficulty breathing  . Swelling of your face and throat  . A fast heartbeat  . A bad rash all over your body  . Dizziness and weakness    Immunizations Administered    Name Date Dose VIS Date Route   Pfizer COVID-19 Vaccine 09/02/2019  3:28 PM 0.3 mL 07/22/2019 Intramuscular   Manufacturer: [place]   Lot: [id number]   [place]: [id number]

## 2019-09-19 ENCOUNTER — Telehealth: Payer: Self-pay | Admitting: Internal Medicine

## 2019-09-19 DIAGNOSIS — M8589 Other specified disorders of bone density and structure, multiple sites: Secondary | ICD-10-CM

## 2019-09-19 DIAGNOSIS — M8580 Other specified disorders of bone density and structure, unspecified site: Secondary | ICD-10-CM

## 2019-09-19 DIAGNOSIS — Z8262 Family history of osteoporosis: Secondary | ICD-10-CM

## 2019-09-19 MED ORDER — LEVOTHYROXINE SODIUM 25 MCG PO TABS: 25.0000 ug | ORAL_TABLET | Freq: Every day | ORAL | 1 refills | Status: DC

## 2019-09-19 NOTE — Telephone Encounter (Signed)
I need more info. What med has she "finished" taking?

## 2019-09-19 NOTE — Telephone Encounter (Signed)
[place] [phone]  [name] [name] called to say she had finished taking her medication, does she now need to get a Bone Density done.

## 2019-09-19 NOTE — Telephone Encounter (Signed)
Smita Haugh [phone]  SYNTHROID 25 MCG tablet (Genreic Brand only)  [place] called to say that she needs prescription for above medication sent to new pharmacy.

## 2019-09-19 NOTE — Telephone Encounter (Signed)
Set up bone density study

## 2019-09-19 NOTE — Telephone Encounter (Signed)
She has taking Ibandronate (Boniva) for one year and has now finished it.

## 2019-09-22 ENCOUNTER — Telehealth: Payer: Self-pay | Admitting: Internal Medicine

## 2019-09-22 NOTE — Telephone Encounter (Signed)
[facility] called to say that imaging called her and let her know she would need to wait until 11/21 to get this so that insurance will pay.

## 2019-09-23 ENCOUNTER — Ambulatory Visit: Payer: Medicare Other | Attending: Internal Medicine

## 2019-09-23 DIAGNOSIS — Z23 Encounter for immunization: Secondary | ICD-10-CM

## 2019-09-23 NOTE — Progress Notes (Signed)
   Covid-19 Vaccination Clinic  Name:  ELIETTE EAKEN    MRN: [id number] DOB: [birth year]  09/23/2019  Ms. Orenstein was observed post Covid-19 immunization for 15 minutes without incidence. She was provided with Vaccine Information Sheet and instruction to access the V-Safe system.   Ms. Kolacz was instructed to call 911 with any severe reactions post vaccine: [name] Difficulty breathing  . Swelling of your face and throat  . A fast heartbeat  . A bad rash all over your body  . Dizziness and weakness    Immunizations Administered    Name Date Dose VIS Date Route   Pfizer COVID-19 Vaccine 09/23/2019  3:22 PM 0.3 mL 07/22/2019 Intramuscular   Manufacturer: [place]   Lot: [id number]   [place]: [id number]

## 2019-09-26 ENCOUNTER — Ambulatory Visit: Payer: Self-pay | Admitting: *Deleted

## 2019-09-26 DIAGNOSIS — F331 Major depressive disorder, recurrent, moderate: Secondary | ICD-10-CM | POA: Diagnosis not present

## 2019-09-26 NOTE — Telephone Encounter (Signed)
Patient with redness and slight warmth at the covid 19 #2 injection site, left upper arm.Injection date 09/23/19  No pain/knot/tenderness. Denies having any other symptoms such as SOB/difficulty swallowing/drooling/swollen tongue/rash.Care Advice included ice pack to the area several times today. Call back if needed.   Reason for Disposition . COVID-19 vaccine, injection site reaction (e.g., pain, redness, swelling), question about  Answer Assessment - Initial Assessment Questions 1. MAIN CONCERN OR SYMPTOM:  "What is your main concern right now?" "What question do you have?" "What's the main symptom you're worried about?" (e.g., fever, pain, redness, swelling)    Red and slightly warm to touch area where she had the covid vaccine #2 2. VACCINE: "What vaccination did you receive?" "Is this your first or second shot?" (e.g., none; Moderna, Coca-Cola, other)     Coca-Cola 3. SYMPTOM ONSET: "When did the  begin?" (e.g., not relevant; hours, days)      Yesterday. 4. SYMPTOM SEVERITY: "How bad is it?"      About 2 inches wide 5. FEVER: "Is there a fever?" If so, ask: "What is it, how was it measured, and when did it start?"     none 6. PAST REACTIONS: "Have you reacted to immunizations before?" If so, ask: "What happened?"     na 7. OTHER SYMPTOMS: "Do you have any other symptoms?"     None-denies all.  Protocols used: CORONAVIRUS (COVID-19) VACCINE QUESTIONS AND REACTIONS-A-AH

## 2019-10-05 ENCOUNTER — Telehealth: Payer: Self-pay | Admitting: Internal Medicine

## 2019-10-05 MED ORDER — ATORVASTATIN CALCIUM 20 MG PO TABS: 20.0000 mg | ORAL_TABLET | Freq: Every day | ORAL | 1 refills | Status: DC

## 2019-10-05 NOTE — Telephone Encounter (Signed)
[name] [name] [phone]  [name] called to say she needs refill for below medication sent to her new pharmacy. She would like a 90 day supply   atorvastatin (LIPITOR) 20 MG tablet   [id number] - [place], [place] Phone:  [phone]  Fax:  [phone]

## 2019-10-07 DIAGNOSIS — Z1231 Encounter for screening mammogram for malignant neoplasm of breast: Secondary | ICD-10-CM | POA: Diagnosis not present

## 2019-10-10 ENCOUNTER — Telehealth: Payer: Self-pay | Admitting: Internal Medicine

## 2019-10-10 NOTE — Telephone Encounter (Signed)
I doubt this is a vaccine reaction. Suggest visit at Urgent care or ED

## 2019-10-10 NOTE — Telephone Encounter (Signed)
Called patient to let her know what Dr Renold Genta said and she said she is feeling much better, she thinks it may have been something she eat. No fever at this  time. She will call back or go to Urgent Care or ED if symptoms come back.

## 2019-10-10 NOTE — Telephone Encounter (Signed)
[name] [name] [phone]  [name] called to say about 6:30 this morning she started having dizziness, nausea, vomiting, diarrhea, chills, and body aches, She was able to sleep about 2 hours. She had 2nd vaccine 2 weeks ago.

## 2019-12-12 DIAGNOSIS — D1801 Hemangioma of skin and subcutaneous tissue: Secondary | ICD-10-CM | POA: Diagnosis not present

## 2019-12-12 DIAGNOSIS — L821 Other seborrheic keratosis: Secondary | ICD-10-CM | POA: Diagnosis not present

## 2019-12-12 DIAGNOSIS — Z85828 Personal history of other malignant neoplasm of skin: Secondary | ICD-10-CM | POA: Diagnosis not present

## 2019-12-12 DIAGNOSIS — D225 Melanocytic nevi of trunk: Secondary | ICD-10-CM | POA: Diagnosis not present

## 2019-12-12 DIAGNOSIS — L738 Other specified follicular disorders: Secondary | ICD-10-CM | POA: Diagnosis not present

## 2020-01-05 DIAGNOSIS — N1831 Chronic kidney disease, stage 3a: Secondary | ICD-10-CM | POA: Diagnosis not present

## 2020-01-05 DIAGNOSIS — K589 Irritable bowel syndrome without diarrhea: Secondary | ICD-10-CM | POA: Diagnosis not present

## 2020-01-06 DIAGNOSIS — N1831 Chronic kidney disease, stage 3a: Secondary | ICD-10-CM | POA: Diagnosis not present

## 2020-01-06 DIAGNOSIS — D631 Anemia in chronic kidney disease: Secondary | ICD-10-CM | POA: Diagnosis not present

## 2020-01-13 ENCOUNTER — Other Ambulatory Visit: Payer: Medicare Other | Admitting: Internal Medicine

## 2020-01-13 ENCOUNTER — Other Ambulatory Visit: Payer: Self-pay

## 2020-01-13 DIAGNOSIS — E039 Hypothyroidism, unspecified: Secondary | ICD-10-CM | POA: Diagnosis not present

## 2020-01-14 LAB — TSH: TSH: 2.78 mIU/L (ref 0.40–4.50)

## 2020-01-16 ENCOUNTER — Ambulatory Visit (INDEPENDENT_AMBULATORY_CARE_PROVIDER_SITE_OTHER): Payer: Medicare Other | Admitting: Internal Medicine

## 2020-01-16 ENCOUNTER — Other Ambulatory Visit: Payer: Self-pay

## 2020-01-16 ENCOUNTER — Encounter: Payer: Self-pay | Admitting: Internal Medicine

## 2020-01-16 VITALS — BP 120/70 | HR 96 | Ht 63.5 in | Wt 136.0 lb

## 2020-01-16 DIAGNOSIS — N1831 Chronic kidney disease, stage 3a: Secondary | ICD-10-CM

## 2020-01-16 DIAGNOSIS — F32A Depression, unspecified: Secondary | ICD-10-CM

## 2020-01-16 DIAGNOSIS — F419 Anxiety disorder, unspecified: Secondary | ICD-10-CM

## 2020-01-16 DIAGNOSIS — E039 Hypothyroidism, unspecified: Secondary | ICD-10-CM | POA: Diagnosis not present

## 2020-01-16 DIAGNOSIS — F329 Major depressive disorder, single episode, unspecified: Secondary | ICD-10-CM

## 2020-01-16 DIAGNOSIS — R7989 Other specified abnormal findings of blood chemistry: Secondary | ICD-10-CM

## 2020-01-16 NOTE — Patient Instructions (Addendum)
New order for Shingrix given.  Watch NSAID consumption.  Continue current medications.  CPE and Medicare wellness visit due December

## 2020-01-16 NOTE — Progress Notes (Signed)
   Subjective:    Patient ID: [name] [name], female    DOB: [birth year], 74 y.o.   MRN: [id number]  HPI 74 year old Female she recently saw Nephrologist regarding elevated creatinine of 1.35 increased from 1.04 in 2019.  She has a history of hypothyroidism on thyroid replacement medication.  Does not carry a diagnosis of hypertension.  She does have hyperlipidemia and hypothyroidism.  She takes Pamelor at bedtime as well as doxepin 25 mg daily.  History of end-stage renal disease in patient's brother but he had issues with alcoholism and apparent bipolar disorder as he took lithium.  He was on dialysis prior to his death.  No history of kidney stones.  Has taken NSAIDs for some time.  Currently is on naproxen and takes 10 to 15 tablets a month.  She has chronic constipation.  Patient had renal ultrasound with some mild diffuse cortical thinning but no obstruction.  Patient was diagnosed with stage IIIa chronic kidney disease.  Dr. Moshe Cipro, nephrologist thought that patient's decreased renal function was mostly due to NSAID use with some age-related nephrosclerosis which is mild in nature.  She needs to use end-stage changes socially.  She is to be followed up there as needed.  With regard to hypothyroidism TSH drawn on June 4 is within normal limits at 2.78 on levothyroxine 25 mcg daily.  No other complaints.      Review of Systems no new complaints     Objective:   Physical Exam BP 120/70, pulse 96 regular, BMI 23.71 pulse oximetry 98% weight 136 pounds  Skin warm and dry.  Nodes none.  Neck is supple without thyromegaly or carotid bruits.  Chest clear to auscultation.  Cardiac exam regular rate and rhythm normal S1 and S2.  No lower extremity pitting edema.       Assessment & Plan:  Stage IIIa chronic kidney disease per nephrologist-watch NSAID consumption  Hypothyroidism-stable on current dose of thyroid replacement 25 mcg daily  History of anxiety and depression-treated with  Pamelor and doxepin  Hyperlipidemia treated with Lipitor.  Lipid panel in December was normal.  Plan: Patient due for health maintenance exam and Medicare wellness visit December 2021.  She will watch NSAID consumption and continue current medications.  This appointment required 30 minutes including precharting, chart review, review of Dr. Shelva Majestic consultation note, review of results and face-to-face time with patient.  Order for Shingrix given.

## 2020-01-23 DIAGNOSIS — M25512 Pain in left shoulder: Secondary | ICD-10-CM | POA: Diagnosis not present

## 2020-01-23 DIAGNOSIS — M7061 Trochanteric bursitis, right hip: Secondary | ICD-10-CM | POA: Diagnosis not present

## 2020-03-14 ENCOUNTER — Other Ambulatory Visit: Payer: Self-pay | Admitting: Internal Medicine

## 2020-04-03 ENCOUNTER — Other Ambulatory Visit: Payer: Self-pay | Admitting: Internal Medicine

## 2020-04-11 DIAGNOSIS — M65312 Trigger thumb, left thumb: Secondary | ICD-10-CM | POA: Diagnosis not present

## 2020-04-24 ENCOUNTER — Telehealth: Payer: Self-pay | Admitting: Internal Medicine

## 2020-04-24 NOTE — Telephone Encounter (Signed)
[name] [name] called to say that her Bone Density is scheduled for 07/11/2020, that will be 2 years for insurance purposes.

## 2020-05-04 DIAGNOSIS — Z23 Encounter for immunization: Secondary | ICD-10-CM | POA: Diagnosis not present

## 2020-05-23 ENCOUNTER — Ambulatory Visit (INDEPENDENT_AMBULATORY_CARE_PROVIDER_SITE_OTHER): Payer: Medicare Other | Admitting: Internal Medicine

## 2020-05-23 ENCOUNTER — Other Ambulatory Visit: Payer: Self-pay

## 2020-05-23 ENCOUNTER — Encounter: Payer: Self-pay | Admitting: Internal Medicine

## 2020-05-23 VITALS — BP 110/70 | HR 94 | Temp 98.0°F | Ht 63.5 in | Wt 137.0 lb

## 2020-05-23 DIAGNOSIS — H35033 Hypertensive retinopathy, bilateral: Secondary | ICD-10-CM | POA: Diagnosis not present

## 2020-05-23 DIAGNOSIS — H52223 Regular astigmatism, bilateral: Secondary | ICD-10-CM | POA: Diagnosis not present

## 2020-05-23 DIAGNOSIS — Z961 Presence of intraocular lens: Secondary | ICD-10-CM | POA: Diagnosis not present

## 2020-05-23 DIAGNOSIS — Z23 Encounter for immunization: Secondary | ICD-10-CM | POA: Diagnosis not present

## 2020-05-23 DIAGNOSIS — I10 Essential (primary) hypertension: Secondary | ICD-10-CM | POA: Diagnosis not present

## 2020-05-23 DIAGNOSIS — H5203 Hypermetropia, bilateral: Secondary | ICD-10-CM | POA: Diagnosis not present

## 2020-05-23 DIAGNOSIS — Z9849 Cataract extraction status, unspecified eye: Secondary | ICD-10-CM | POA: Diagnosis not present

## 2020-05-23 DIAGNOSIS — H524 Presbyopia: Secondary | ICD-10-CM | POA: Diagnosis not present

## 2020-05-23 NOTE — Patient Instructions (Signed)
Patient received a flu vaccine IM L deltoid, AV, CMA  

## 2020-05-23 NOTE — Progress Notes (Signed)
Flu Vaccine per CMA 

## 2020-06-09 ENCOUNTER — Other Ambulatory Visit: Payer: Self-pay | Admitting: Internal Medicine

## 2020-07-09 ENCOUNTER — Other Ambulatory Visit: Payer: Self-pay | Admitting: Internal Medicine

## 2020-07-11 ENCOUNTER — Encounter: Payer: Self-pay | Admitting: Internal Medicine

## 2020-07-16 ENCOUNTER — Encounter: Payer: Self-pay | Admitting: Internal Medicine

## 2020-07-20 ENCOUNTER — Encounter: Payer: Self-pay | Admitting: Internal Medicine

## 2020-08-13 ENCOUNTER — Other Ambulatory Visit: Payer: Medicare Other | Admitting: Internal Medicine

## 2020-08-15 ENCOUNTER — Telehealth: Payer: Self-pay | Admitting: Internal Medicine

## 2020-08-15 ENCOUNTER — Encounter: Payer: Self-pay | Admitting: Internal Medicine

## 2020-08-15 ENCOUNTER — Other Ambulatory Visit: Payer: Self-pay

## 2020-08-15 ENCOUNTER — Ambulatory Visit (INDEPENDENT_AMBULATORY_CARE_PROVIDER_SITE_OTHER): Payer: Medicare Other | Admitting: Internal Medicine

## 2020-08-15 VITALS — BP 130/80 | HR 88 | Temp 98.0°F | Ht 63.5 in | Wt 134.0 lb

## 2020-08-15 DIAGNOSIS — N952 Postmenopausal atrophic vaginitis: Secondary | ICD-10-CM

## 2020-08-15 DIAGNOSIS — B009 Herpesviral infection, unspecified: Secondary | ICD-10-CM

## 2020-08-15 MED ORDER — CLOTRIMAZOLE-BETAMETHASONE 1-0.05 % EX CREA: 1.0000 "application " | TOPICAL_CREAM | Freq: Two times a day (BID) | CUTANEOUS | 2 refills | Status: DC

## 2020-08-15 MED ORDER — VALACYCLOVIR HCL 500 MG PO TABS: 500.0000 mg | ORAL_TABLET | Freq: Two times a day (BID) | ORAL | 99 refills | Status: AC

## 2020-08-15 NOTE — Telephone Encounter (Signed)
Called patient to let her know to call GYN and she stated she has nit been to a GYN in years, She does not remember anything about a referral to a gyn in 2020.

## 2020-08-15 NOTE — Patient Instructions (Signed)
You have an upcoming health maintenance exam later this week.  Use Lotrisone cream in vaginal area twice daily.  Take Valtrex 500 mg twice daily for 5 days.  See you tomorrow for fasting labs prior to your upcoming exam.

## 2020-08-15 NOTE — Telephone Encounter (Signed)
[place] [phone]  [name] called to say a couple of days ago she started getting a vaginal sore and itching, she wanted to see if you could call in something.

## 2020-08-15 NOTE — Telephone Encounter (Signed)
Coming before 12pm today.

## 2020-08-15 NOTE — Telephone Encounter (Signed)
Please arrange office visit.

## 2020-08-15 NOTE — Progress Notes (Signed)
   Subjective:    Patient ID: [name] [name], female    DOB: 12-Jul-1946, 75 y.o.   MRN: [id number]  HPI 75 year old seen with vaginal irritation and itching.  Has not had this lately.  At one point we considered referring her to [facility] back in August 2020.  She was having burning in the labial area.  We attempted to do a Herpes culture but we did not have the right collection equipment so the test was not [name].  There was redness in the right inner labia without ulceration at that time.  We were wondering if she had lichen sclerosis versus atrophic vaginitis.  However she says at that time she had discovered she put Retin-A on her labia by mistake.  Has applied to external genitalia twice daily.  Valtrex 500 mg twice daily for 5 days.  Seen for the same complaint in 2019 and a couple of times in 2020.  Patient is currently not sexually active.  Thinks maybe ex-husband had herpes.  This makes her anxious.  She has history of bipolar disorder and hypothyroidism.  She has had 3 COVID-19 vaccines and high-dose flu vaccine.   Review of Systems see above     Objective:   Physical Exam Blood pressure 130/80 pulse 88 pulse oximetry 97% temperature 98 degrees orally  On exam she has irritation left labia but no ulceration.  Over the clitoris she has 2 small ulcerations that I have never seen before.  Her main complaint is the irritation of the left labia.     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  Take Valtrex 500 mg twice daily for 5 days.

## 2020-08-15 NOTE — Telephone Encounter (Signed)
She needs to call her GYN- this is where we referred her in 2020.

## 2020-08-16 ENCOUNTER — Other Ambulatory Visit: Payer: Medicare Other | Admitting: Internal Medicine

## 2020-08-16 ENCOUNTER — Telehealth: Payer: Self-pay | Admitting: Internal Medicine

## 2020-08-16 DIAGNOSIS — Z0000 Encounter for general adult medical examination without abnormal findings: Secondary | ICD-10-CM | POA: Diagnosis not present

## 2020-08-16 DIAGNOSIS — F419 Anxiety disorder, unspecified: Secondary | ICD-10-CM | POA: Diagnosis not present

## 2020-08-16 DIAGNOSIS — F32A Depression, unspecified: Secondary | ICD-10-CM | POA: Diagnosis not present

## 2020-08-16 DIAGNOSIS — K589 Irritable bowel syndrome without diarrhea: Secondary | ICD-10-CM | POA: Diagnosis not present

## 2020-08-16 DIAGNOSIS — E781 Pure hyperglyceridemia: Secondary | ICD-10-CM | POA: Diagnosis not present

## 2020-08-16 DIAGNOSIS — F3170 Bipolar disorder, currently in remission, most recent episode unspecified: Secondary | ICD-10-CM | POA: Diagnosis not present

## 2020-08-16 DIAGNOSIS — M8589 Other specified disorders of bone density and structure, multiple sites: Secondary | ICD-10-CM | POA: Diagnosis not present

## 2020-08-16 DIAGNOSIS — E039 Hypothyroidism, unspecified: Secondary | ICD-10-CM | POA: Diagnosis not present

## 2020-08-16 DIAGNOSIS — Z8262 Family history of osteoporosis: Secondary | ICD-10-CM | POA: Diagnosis not present

## 2020-08-16 DIAGNOSIS — K5904 Chronic idiopathic constipation: Secondary | ICD-10-CM | POA: Diagnosis not present

## 2020-08-16 DIAGNOSIS — M8580 Other specified disorders of bone density and structure, unspecified site: Secondary | ICD-10-CM | POA: Diagnosis not present

## 2020-08-16 DIAGNOSIS — N1831 Chronic kidney disease, stage 3a: Secondary | ICD-10-CM | POA: Diagnosis not present

## 2020-08-16 NOTE — Telephone Encounter (Signed)
She was here yesterday so I don't think it matters as long as everyone is wearing propective equipment /masks and she has no symptoms.

## 2020-08-16 NOTE — Telephone Encounter (Signed)
[name] [name] [phone]  [name] called to say that she went to a Luncheon at [place] on Sunday that someone has now tested positive for Covid, they started getting sick on Monday and results came back yesterday. Should she come in today for labs and tomorrow for her CPE or should we reschedule for later in the month or next month. She is having no symptoms at this time.

## 2020-08-17 ENCOUNTER — Ambulatory Visit (INDEPENDENT_AMBULATORY_CARE_PROVIDER_SITE_OTHER): Payer: Medicare Other | Admitting: Internal Medicine

## 2020-08-17 ENCOUNTER — Other Ambulatory Visit: Payer: Self-pay

## 2020-08-17 ENCOUNTER — Encounter: Payer: Self-pay | Admitting: Internal Medicine

## 2020-08-17 VITALS — BP 140/90 | HR 114 | Ht 63.0 in | Wt 138.0 lb

## 2020-08-17 DIAGNOSIS — F3170 Bipolar disorder, currently in remission, most recent episode unspecified: Secondary | ICD-10-CM

## 2020-08-17 DIAGNOSIS — N1831 Chronic kidney disease, stage 3a: Secondary | ICD-10-CM

## 2020-08-17 DIAGNOSIS — E781 Pure hyperglyceridemia: Secondary | ICD-10-CM | POA: Diagnosis not present

## 2020-08-17 DIAGNOSIS — K5904 Chronic idiopathic constipation: Secondary | ICD-10-CM

## 2020-08-17 DIAGNOSIS — Z0000 Encounter for general adult medical examination without abnormal findings: Secondary | ICD-10-CM | POA: Diagnosis not present

## 2020-08-17 DIAGNOSIS — E039 Hypothyroidism, unspecified: Secondary | ICD-10-CM | POA: Diagnosis not present

## 2020-08-17 DIAGNOSIS — K589 Irritable bowel syndrome without diarrhea: Secondary | ICD-10-CM | POA: Diagnosis not present

## 2020-08-17 DIAGNOSIS — Z86010 Personal history of colonic polyps: Secondary | ICD-10-CM

## 2020-08-17 DIAGNOSIS — M8580 Other specified disorders of bone density and structure, unspecified site: Secondary | ICD-10-CM

## 2020-08-17 LAB — POCT URINALYSIS DIPSTICK
Appearance: NEGATIVE
Bilirubin, UA: NEGATIVE
Blood, UA: NEGATIVE
Glucose, UA: NEGATIVE
Ketones, UA: NEGATIVE
Leukocytes, UA: NEGATIVE
Nitrite, UA: NEGATIVE
Odor: NEGATIVE
Protein, UA: NEGATIVE
Spec Grav, UA: 1.02 (ref 1.010–1.025)
Urobilinogen, UA: 0.2 E.U./dL
pH, UA: 6 (ref 5.0–8.0)

## 2020-08-17 LAB — COMPLETE METABOLIC PANEL WITH GFR
AG Ratio: 1.5 (calc) (ref 1.0–2.5)
ALT: 22 U/L (ref 6–29)
AST: 24 U/L (ref 10–35)
Albumin: 4.2 g/dL (ref 3.6–5.1)
Alkaline phosphatase (APISO): 78 U/L (ref 37–153)
BUN/Creatinine Ratio: 13 (calc) (ref 6–22)
BUN: 16 mg/dL (ref 7–25)
CO2: 29 mmol/L (ref 20–32)
Calcium: 9.7 mg/dL (ref 8.6–10.4)
Chloride: 104 mmol/L (ref 98–110)
Creat: 1.2 mg/dL — ABNORMAL HIGH (ref 0.60–0.93)
GFR, Est African American: 51 mL/min/{1.73_m2} — ABNORMAL LOW (ref >=60)
GFR, Est Non African American: 44 mL/min/{1.73_m2} — ABNORMAL LOW (ref >=60)
Globulin: 2.8 g/dL (calc) (ref 1.9–3.7)
Glucose, Bld: 88 mg/dL (ref 65–99)
Potassium: 4.4 mmol/L (ref 3.5–5.3)
Sodium: 141 mmol/L (ref 135–146)
Total Bilirubin: 0.5 mg/dL (ref 0.2–1.2)
Total Protein: 7 g/dL (ref 6.1–8.1)

## 2020-08-17 LAB — CBC WITH DIFFERENTIAL/PLATELET
Absolute Monocytes: 348 cells/uL (ref 200–950)
Basophils Absolute: 29 cells/uL (ref 0–200)
Basophils Relative: 0.6 %
Eosinophils Absolute: 132 cells/uL (ref 15–500)
Eosinophils Relative: 2.7 %
HCT: 44 % (ref 35.0–45.0)
Hemoglobin: 14.7 g/dL (ref 11.7–15.5)
Lymphs Abs: 1779 cells/uL (ref 850–3900)
MCH: 30.9 pg (ref 27.0–33.0)
MCHC: 33.4 g/dL (ref 32.0–36.0)
MCV: 92.6 fL (ref 80.0–100.0)
MPV: 9.4 fL (ref 7.5–12.5)
Monocytes Relative: 7.1 %
Neutro Abs: 2612 cells/uL (ref 1500–7800)
Neutrophils Relative %: 53.3 %
Platelets: 197 10*3/uL (ref 140–400)
RBC: 4.75 10*6/uL (ref 3.80–5.10)
RDW: 11.8 % (ref 11.0–15.0)
Total Lymphocyte: 36.3 %
WBC: 4.9 10*3/uL (ref 3.8–10.8)

## 2020-08-17 LAB — LIPID PANEL
Cholesterol: 148 mg/dL (ref <200)
HDL: 46 mg/dL — ABNORMAL LOW (ref >=50)
LDL Cholesterol (Calc): 68 mg/dL (calc)
Non-HDL Cholesterol (Calc): 102 mg/dL (calc) (ref <130)
Total CHOL/HDL Ratio: 3.2 (calc) (ref <5.0)
Triglycerides: 247 mg/dL — ABNORMAL HIGH (ref <150)

## 2020-08-17 LAB — TSH: TSH: 2.96 mIU/L (ref 0.40–4.50)

## 2020-08-17 MED ORDER — IBANDRONATE SODIUM 150 MG PO TABS: 150.0000 mg | ORAL_TABLET | ORAL | 99 refills | Status: DC
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
## 2020-08-17 NOTE — Patient Instructions (Addendum)
It was a pleasure to see you today.  Watch diet.  We should recheck lipids in 6 months along with serum creatinine.  Continue current medications.

## 2020-09-04 DIAGNOSIS — L738 Other specified follicular disorders: Secondary | ICD-10-CM | POA: Diagnosis not present

## 2020-09-04 DIAGNOSIS — L298 Other pruritus: Secondary | ICD-10-CM | POA: Diagnosis not present

## 2020-09-04 DIAGNOSIS — D1801 Hemangioma of skin and subcutaneous tissue: Secondary | ICD-10-CM | POA: Diagnosis not present

## 2020-09-04 DIAGNOSIS — L821 Other seborrheic keratosis: Secondary | ICD-10-CM | POA: Diagnosis not present

## 2020-09-04 DIAGNOSIS — L570 Actinic keratosis: Secondary | ICD-10-CM | POA: Diagnosis not present

## 2020-09-04 DIAGNOSIS — Z85828 Personal history of other malignant neoplasm of skin: Secondary | ICD-10-CM | POA: Diagnosis not present

## 2020-09-04 DIAGNOSIS — L820 Inflamed seborrheic keratosis: Secondary | ICD-10-CM | POA: Diagnosis not present

## 2020-09-05 ENCOUNTER — Encounter: Payer: Self-pay | Admitting: Internal Medicine

## 2020-10-07 ENCOUNTER — Other Ambulatory Visit: Payer: Self-pay | Admitting: Internal Medicine

## 2020-10-10 DIAGNOSIS — Z1231 Encounter for screening mammogram for malignant neoplasm of breast: Secondary | ICD-10-CM | POA: Diagnosis not present

## 2020-10-10 LAB — HM MAMMOGRAPHY

## 2020-10-15 ENCOUNTER — Encounter: Payer: Self-pay | Admitting: Internal Medicine

## 2020-12-14 ENCOUNTER — Other Ambulatory Visit: Payer: Self-pay | Admitting: Internal Medicine

## 2020-12-17 DIAGNOSIS — D485 Neoplasm of uncertain behavior of skin: Secondary | ICD-10-CM | POA: Diagnosis not present

## 2020-12-17 DIAGNOSIS — L821 Other seborrheic keratosis: Secondary | ICD-10-CM | POA: Diagnosis not present

## 2020-12-17 DIAGNOSIS — D225 Melanocytic nevi of trunk: Secondary | ICD-10-CM | POA: Diagnosis not present

## 2020-12-17 DIAGNOSIS — Z85828 Personal history of other malignant neoplasm of skin: Secondary | ICD-10-CM | POA: Diagnosis not present

## 2020-12-17 DIAGNOSIS — L738 Other specified follicular disorders: Secondary | ICD-10-CM | POA: Diagnosis not present

## 2020-12-17 DIAGNOSIS — D1801 Hemangioma of skin and subcutaneous tissue: Secondary | ICD-10-CM | POA: Diagnosis not present

## 2020-12-19 DIAGNOSIS — Z23 Encounter for immunization: Secondary | ICD-10-CM | POA: Diagnosis not present

## 2021-02-22 ENCOUNTER — Other Ambulatory Visit: Payer: Self-pay

## 2021-02-22 ENCOUNTER — Other Ambulatory Visit: Payer: Medicare Other | Admitting: Internal Medicine

## 2021-02-22 DIAGNOSIS — E781 Pure hyperglyceridemia: Secondary | ICD-10-CM

## 2021-02-22 DIAGNOSIS — N1831 Chronic kidney disease, stage 3a: Secondary | ICD-10-CM

## 2021-02-22 DIAGNOSIS — M8589 Other specified disorders of bone density and structure, multiple sites: Secondary | ICD-10-CM | POA: Diagnosis not present

## 2021-02-22 DIAGNOSIS — Z8262 Family history of osteoporosis: Secondary | ICD-10-CM

## 2021-02-22 DIAGNOSIS — F3170 Bipolar disorder, currently in remission, most recent episode unspecified: Secondary | ICD-10-CM | POA: Diagnosis not present

## 2021-02-22 DIAGNOSIS — K589 Irritable bowel syndrome without diarrhea: Secondary | ICD-10-CM | POA: Diagnosis not present

## 2021-02-22 DIAGNOSIS — F32A Depression, unspecified: Secondary | ICD-10-CM | POA: Diagnosis not present

## 2021-02-22 DIAGNOSIS — Z0000 Encounter for general adult medical examination without abnormal findings: Secondary | ICD-10-CM

## 2021-02-22 DIAGNOSIS — F419 Anxiety disorder, unspecified: Secondary | ICD-10-CM

## 2021-02-22 DIAGNOSIS — E039 Hypothyroidism, unspecified: Secondary | ICD-10-CM

## 2021-02-22 DIAGNOSIS — M8580 Other specified disorders of bone density and structure, unspecified site: Secondary | ICD-10-CM

## 2021-02-23 LAB — COMPLETE METABOLIC PANEL WITH GFR
AG Ratio: 1.8 (calc) (ref 1.0–2.5)
ALT: 23 U/L (ref 6–29)
AST: 25 U/L (ref 10–35)
Albumin: 4.5 g/dL (ref 3.6–5.1)
Alkaline phosphatase (APISO): 58 U/L (ref 37–153)
BUN/Creatinine Ratio: 17 (calc) (ref 6–22)
BUN: 21 mg/dL (ref 7–25)
CO2: 30 mmol/L (ref 20–32)
Calcium: 9.5 mg/dL (ref 8.6–10.4)
Chloride: 105 mmol/L (ref 98–110)
Creat: 1.25 mg/dL — ABNORMAL HIGH (ref 0.60–1.00)
Globulin: 2.5 g/dL (calc) (ref 1.9–3.7)
Glucose, Bld: 90 mg/dL (ref 65–99)
Potassium: 5 mmol/L (ref 3.5–5.3)
Sodium: 141 mmol/L (ref 135–146)
Total Bilirubin: 0.4 mg/dL (ref 0.2–1.2)
Total Protein: 7 g/dL (ref 6.1–8.1)
eGFR: 45 mL/min/{1.73_m2} — ABNORMAL LOW (ref >=60)

## 2021-02-23 LAB — LIPID PANEL
Cholesterol: 157 mg/dL (ref <200)
HDL: 46 mg/dL — ABNORMAL LOW (ref >=50)
LDL Cholesterol (Calc): 78 mg/dL (calc)
Non-HDL Cholesterol (Calc): 111 mg/dL (calc) (ref <130)
Total CHOL/HDL Ratio: 3.4 (calc) (ref <5.0)
Triglycerides: 248 mg/dL — ABNORMAL HIGH (ref <150)

## 2021-02-23 LAB — CBC WITH DIFFERENTIAL/PLATELET
Absolute Monocytes: 415 cells/uL (ref 200–950)
Basophils Absolute: 30 cells/uL (ref 0–200)
Basophils Relative: 0.6 %
Eosinophils Absolute: 80 cells/uL (ref 15–500)
Eosinophils Relative: 1.6 %
HCT: 43.8 % (ref 35.0–45.0)
Hemoglobin: 14.6 g/dL (ref 11.7–15.5)
Lymphs Abs: 1865 cells/uL (ref 850–3900)
MCH: 31.1 pg (ref 27.0–33.0)
MCHC: 33.3 g/dL (ref 32.0–36.0)
MCV: 93.2 fL (ref 80.0–100.0)
MPV: 9.5 fL (ref 7.5–12.5)
Monocytes Relative: 8.3 %
Neutro Abs: 2610 cells/uL (ref 1500–7800)
Neutrophils Relative %: 52.2 %
Platelets: 214 10*3/uL (ref 140–400)
RBC: 4.7 10*6/uL (ref 3.80–5.10)
RDW: 11.8 % (ref 11.0–15.0)
Total Lymphocyte: 37.3 %
WBC: 5 10*3/uL (ref 3.8–10.8)

## 2021-02-23 LAB — TSH: TSH: 3.63 mIU/L (ref 0.40–4.50)

## 2021-02-25 ENCOUNTER — Other Ambulatory Visit: Payer: Self-pay | Admitting: Internal Medicine

## 2021-02-25 ENCOUNTER — Other Ambulatory Visit: Payer: Self-pay

## 2021-02-25 ENCOUNTER — Ambulatory Visit (INDEPENDENT_AMBULATORY_CARE_PROVIDER_SITE_OTHER): Payer: Medicare Other | Admitting: Internal Medicine

## 2021-02-25 ENCOUNTER — Encounter: Payer: Self-pay | Admitting: Internal Medicine

## 2021-02-25 VITALS — BP 160/90 | HR 92 | Wt 133.0 lb

## 2021-02-25 DIAGNOSIS — F3170 Bipolar disorder, currently in remission, most recent episode unspecified: Secondary | ICD-10-CM | POA: Diagnosis not present

## 2021-02-25 DIAGNOSIS — E781 Pure hyperglyceridemia: Secondary | ICD-10-CM | POA: Diagnosis not present

## 2021-02-25 DIAGNOSIS — K219 Gastro-esophageal reflux disease without esophagitis: Secondary | ICD-10-CM | POA: Diagnosis not present

## 2021-02-25 DIAGNOSIS — M8580 Other specified disorders of bone density and structure, unspecified site: Secondary | ICD-10-CM | POA: Diagnosis not present

## 2021-02-25 DIAGNOSIS — N1831 Chronic kidney disease, stage 3a: Secondary | ICD-10-CM

## 2021-02-25 DIAGNOSIS — I252 Old myocardial infarction: Secondary | ICD-10-CM

## 2021-02-25 DIAGNOSIS — E039 Hypothyroidism, unspecified: Secondary | ICD-10-CM

## 2021-02-25 MED ORDER — ATORVASTATIN CALCIUM 40 MG PO TABS: 40.0000 mg | ORAL_TABLET | Freq: Every day | ORAL | 3 refills | Status: DC

## 2021-02-25 NOTE — Progress Notes (Signed)
   Subjective:    Patient ID: [name] [name], female    DOB: [birth year], 75 y.o.   MRN: [id number]  HPI 75 year old Female here for 6 month recheck. Says she does not know why she is here. Reminded her she has hyperlipidemia and chronic kidney disease in addition to hypothyroidism. Has seen Dr. Nyoka Cowden, Gastroenterologist in [place] for SIBO and chronic constipation for which she takes Miralax. History of bipolar disorder seen by Dr. Casimiro Needle.  In May 2021 she was seen in [facility] regarding elevated serum creatinine.  Was diagnosed with chronic kidney disease stage IIIa.  Her brother had a history of end-stage renal disease with history of alcoholism and bipolar disorder.  He was treated with lithium.  She does have an NSTEMI history.   Review of Systems started walking 6-8 weeks ago and says she is walking a mile a day. Likes sweets. No hx of DM  Takes Unisom for sleep and gabapentin nightly and is supposed to be on Doxepin by Dr. Casimiro Needle.     Objective:   Physical Exam        Assessment & Plan:  Chronic kidney disease stage IIIa-stable with creatinine 1.25  History of bipolar disorder seen by Dr. Casimiro Needle.  Currently on doxepin 25 mg daily.  In the past, has taken gabapentin for sleep and Pamelor at bedtime as well.  Hypothyroidism stable on thyroid replacement low-dose  History of NSTEMI  Hyperlipidemia treated with Lipitor-triglycerides elevated at 2.48 and were 2.476 months ago.  Is supposed to be on Lipitor 40 mg daily  Hypothyroidism-TSH 3.63-is on low-dose levothyroxine 25 mcg daily.  Continue to monitor.  We can increase this if we would like to see TSH closer to 1.00.  Osteopenia treated with Boniva 150 mg every 30 days  GE reflux treated with Nexium and stable  History of SIBO/irritable bowel seen by gastroenterologist in [place].  This seems to be stable.    Plan: Creatinine is stable at 1.25, was 1.20 in January 2022 and 1.22 in  December 2020.  Highest creatinine in the past 4 years was 1.35 in December 2020.  She will continue current medications.  Advised to limit NSAIDs for musculoskeletal pain if possible, Tylenol preferable to NSAIDs, stay well-hydrated.  Follow-up in 6 months for health maintenance exam and Medicare wellness visit

## 2021-03-01 NOTE — Patient Instructions (Addendum)
Watch NSAID intake for headaches and musculoskeletal pain.  Continue with current medications and follow-up in 6 months for Medicare wellness and health maintenance exam.  No change in medications.  Creatinine is stable at this time.  Continue with current dose of thyroid replacement.  Continue with Boniva 150 mg monthly.  Continue Nexium for GE reflux.

## 2021-03-08 ENCOUNTER — Encounter: Payer: Self-pay | Admitting: Internal Medicine

## 2021-03-08 ENCOUNTER — Telehealth: Payer: Self-pay | Admitting: Internal Medicine

## 2021-03-08 ENCOUNTER — Ambulatory Visit (INDEPENDENT_AMBULATORY_CARE_PROVIDER_SITE_OTHER): Payer: Medicare Other | Admitting: Internal Medicine

## 2021-03-08 ENCOUNTER — Other Ambulatory Visit: Payer: Self-pay

## 2021-03-08 VITALS — BP 160/80 | HR 102 | Temp 98.0°F | Ht 63.5 in | Wt 132.0 lb

## 2021-03-08 DIAGNOSIS — R300 Dysuria: Secondary | ICD-10-CM

## 2021-03-08 DIAGNOSIS — F3170 Bipolar disorder, currently in remission, most recent episode unspecified: Secondary | ICD-10-CM

## 2021-03-08 DIAGNOSIS — F411 Generalized anxiety disorder: Secondary | ICD-10-CM | POA: Diagnosis not present

## 2021-03-08 DIAGNOSIS — N342 Other urethritis: Secondary | ICD-10-CM | POA: Diagnosis not present

## 2021-03-08 DIAGNOSIS — N1831 Chronic kidney disease, stage 3a: Secondary | ICD-10-CM

## 2021-03-08 LAB — POCT URINALYSIS DIPSTICK
Appearance: NEGATIVE
Bilirubin, UA: NEGATIVE
Blood, UA: NEGATIVE
Glucose, UA: NEGATIVE
Ketones, UA: NEGATIVE
Leukocytes, UA: NEGATIVE
Nitrite, UA: NEGATIVE
Odor: NEGATIVE
Protein, UA: NEGATIVE
Spec Grav, UA: 1.015 (ref 1.010–1.025)
Urobilinogen, UA: 0.2 E.U./dL
pH, UA: 6.5 (ref 5.0–8.0)

## 2021-03-08 MED ORDER — CLOTRIMAZOLE-BETAMETHASONE 1-0.05 % EX CREA: 1.0000 "application " | TOPICAL_CREAM | Freq: Two times a day (BID) | CUTANEOUS | 0 refills | Status: DC

## 2021-03-08 NOTE — Telephone Encounter (Signed)
[name] [name] [phone]  [name] called to say she is having burning and it is bubble in toilet when she urinates. I went ahead and schedule her for appointment this afternoon.

## 2021-03-08 NOTE — Progress Notes (Signed)
   Subjective:    Patient ID: [name] [name], female    DOB: [birth year], 75 y.o.   MRN: [id number]  HPI 75 year old Female in today with complaint of dysuria that started this morning.  She has no fever, chills, nausea or vomiting.  No history of recurrent urinary infections.  This was previously discussed with her in detail on July 18 and was once again discussed again today.  She has more questions.  She has hyperlipidemia and hypothyroidism.  She has a history of bipolar disorder treated by Dr. Casimiro Needle.  She has stage IIIa chronic kidney disease with creatinine stable at 1.25.  Highest creatinine in the past 4 years was 1.35 in December 2020.  Creatinine in January 2022 was 1.20.  History of osteopenia treated with Boniva every 30 days.  History of GE reflux.  History of irritable bowel syndrome/SIBO seen by gastroenterologist in [place] to her that chronic kidney disease can be genetic, due to NSAID, due to hypertension, due to diabetes, due to intrinsic kidney disease. If she has more questions and can be certainly referred back to [facility] for discussion.  However her kidney functions are stable.  This issue of this area is not related to chronic kidney disease but likely to be urethritis or UTI.  Review of Systems see above     Objective:   Physical Exam  Vital signs reviewed.  She is afebrile.  Urine dipstick is completely normal with specific gravity 1.015 without evidence of protein, blood or white blood cells.  Nitrite is negative.  She has no CVA tenderness.  A culture was sent.      Assessment & Plan:  Dysuria-?  Acute UTI versus urethritis.  Culture is negative as of final result on Sunday, July 31.  Therefore diagnosis is urethritis and not acute UTI.  Chronic kidney disease stage IIIa  Anxiety state -upset today over diagnosis of chronic kidney disease which has been going on for several years  History of bipolar disorder in  remission  Plan: Culture is pending.  Urine dipstick is normal.  We will notify her of culture results and further treatment if necessary.

## 2021-03-09 LAB — URINE CULTURE
MICRO NUMBER:: 12179852
Result:: NO GROWTH
SPECIMEN QUALITY:: ADEQUATE

## 2021-03-10 NOTE — Patient Instructions (Signed)
Urine dipstick is normal.  Culture was sent.  We will advise you of results and further treatment if needed.

## 2021-04-01 ENCOUNTER — Other Ambulatory Visit: Payer: Self-pay | Admitting: Internal Medicine

## 2021-04-13 ENCOUNTER — Other Ambulatory Visit: Payer: Self-pay | Admitting: Internal Medicine

## 2021-05-13 DIAGNOSIS — Z23 Encounter for immunization: Secondary | ICD-10-CM | POA: Diagnosis not present

## 2021-05-27 DIAGNOSIS — H524 Presbyopia: Secondary | ICD-10-CM | POA: Diagnosis not present

## 2021-05-27 DIAGNOSIS — H35033 Hypertensive retinopathy, bilateral: Secondary | ICD-10-CM | POA: Diagnosis not present

## 2021-05-27 DIAGNOSIS — I10 Essential (primary) hypertension: Secondary | ICD-10-CM | POA: Diagnosis not present

## 2021-05-27 DIAGNOSIS — H52223 Regular astigmatism, bilateral: Secondary | ICD-10-CM | POA: Diagnosis not present

## 2021-05-27 DIAGNOSIS — H5203 Hypermetropia, bilateral: Secondary | ICD-10-CM | POA: Diagnosis not present

## 2021-05-27 DIAGNOSIS — Z9849 Cataract extraction status, unspecified eye: Secondary | ICD-10-CM | POA: Diagnosis not present

## 2021-06-13 ENCOUNTER — Ambulatory Visit (INDEPENDENT_AMBULATORY_CARE_PROVIDER_SITE_OTHER): Payer: Medicare Other | Admitting: Internal Medicine

## 2021-06-13 ENCOUNTER — Other Ambulatory Visit: Payer: Self-pay

## 2021-06-13 DIAGNOSIS — Z23 Encounter for immunization: Secondary | ICD-10-CM | POA: Diagnosis not present

## 2021-06-28 ENCOUNTER — Other Ambulatory Visit: Payer: Self-pay | Admitting: Internal Medicine

## 2021-07-30 DIAGNOSIS — L738 Other specified follicular disorders: Secondary | ICD-10-CM | POA: Diagnosis not present

## 2021-07-30 DIAGNOSIS — L720 Epidermal cyst: Secondary | ICD-10-CM | POA: Diagnosis not present

## 2021-07-30 DIAGNOSIS — Z85828 Personal history of other malignant neoplasm of skin: Secondary | ICD-10-CM | POA: Diagnosis not present

## 2021-08-15 ENCOUNTER — Encounter: Payer: Self-pay | Admitting: Internal Medicine

## 2021-08-16 ENCOUNTER — Other Ambulatory Visit: Payer: Medicare Other | Admitting: Internal Medicine

## 2021-08-16 ENCOUNTER — Other Ambulatory Visit: Payer: Self-pay

## 2021-08-16 DIAGNOSIS — I10 Essential (primary) hypertension: Secondary | ICD-10-CM | POA: Diagnosis not present

## 2021-08-16 DIAGNOSIS — E039 Hypothyroidism, unspecified: Secondary | ICD-10-CM | POA: Diagnosis not present

## 2021-08-16 DIAGNOSIS — E781 Pure hyperglyceridemia: Secondary | ICD-10-CM | POA: Diagnosis not present

## 2021-08-17 LAB — LIPID PANEL
Cholesterol: 158 mg/dL (ref <200)
HDL: 54 mg/dL (ref >=50)
LDL Cholesterol (Calc): 75 mg/dL (calc)
Non-HDL Cholesterol (Calc): 104 mg/dL (calc) (ref <130)
Total CHOL/HDL Ratio: 2.9 (calc) (ref <5.0)
Triglycerides: 203 mg/dL — ABNORMAL HIGH (ref <150)

## 2021-08-17 LAB — CBC WITH DIFFERENTIAL/PLATELET
Absolute Monocytes: 406 cells/uL (ref 200–950)
Basophils Absolute: 42 cells/uL (ref 0–200)
Basophils Relative: 0.8 %
Eosinophils Absolute: 99 cells/uL (ref 15–500)
Eosinophils Relative: 1.9 %
HCT: 45.5 % — ABNORMAL HIGH (ref 35.0–45.0)
Hemoglobin: 14.9 g/dL (ref 11.7–15.5)
Lymphs Abs: 1815 cells/uL (ref 850–3900)
MCH: 31 pg (ref 27.0–33.0)
MCHC: 32.7 g/dL (ref 32.0–36.0)
MCV: 94.6 fL (ref 80.0–100.0)
MPV: 9.2 fL (ref 7.5–12.5)
Monocytes Relative: 7.8 %
Neutro Abs: 2839 cells/uL (ref 1500–7800)
Neutrophils Relative %: 54.6 %
Platelets: 232 10*3/uL (ref 140–400)
RBC: 4.81 10*6/uL (ref 3.80–5.10)
RDW: 11.5 % (ref 11.0–15.0)
Total Lymphocyte: 34.9 %
WBC: 5.2 10*3/uL (ref 3.8–10.8)

## 2021-08-17 LAB — COMPLETE METABOLIC PANEL WITH GFR
AG Ratio: 1.7 (calc) (ref 1.0–2.5)
ALT: 22 U/L (ref 6–29)
AST: 26 U/L (ref 10–35)
Albumin: 4.5 g/dL (ref 3.6–5.1)
Alkaline phosphatase (APISO): 62 U/L (ref 37–153)
BUN/Creatinine Ratio: 15 (calc) (ref 6–22)
BUN: 20 mg/dL (ref 7–25)
CO2: 27 mmol/L (ref 20–32)
Calcium: 10.1 mg/dL (ref 8.6–10.4)
Chloride: 106 mmol/L (ref 98–110)
Creat: 1.35 mg/dL — ABNORMAL HIGH (ref 0.60–1.00)
Globulin: 2.7 g/dL (calc) (ref 1.9–3.7)
Glucose, Bld: 88 mg/dL (ref 65–99)
Potassium: 4.9 mmol/L (ref 3.5–5.3)
Sodium: 142 mmol/L (ref 135–146)
Total Bilirubin: 0.6 mg/dL (ref 0.2–1.2)
Total Protein: 7.2 g/dL (ref 6.1–8.1)
eGFR: 41 mL/min/{1.73_m2} — ABNORMAL LOW (ref >=60)

## 2021-08-17 LAB — TSH: TSH: 4.07 mIU/L (ref 0.40–4.50)

## 2021-08-19 ENCOUNTER — Other Ambulatory Visit: Payer: Self-pay

## 2021-08-19 ENCOUNTER — Encounter: Payer: Self-pay | Admitting: Internal Medicine

## 2021-08-19 ENCOUNTER — Ambulatory Visit (INDEPENDENT_AMBULATORY_CARE_PROVIDER_SITE_OTHER): Payer: Medicare Other | Admitting: Internal Medicine

## 2021-08-19 VITALS — BP 150/82 | HR 97 | Temp 97.9°F | Ht 64.5 in | Wt 128.0 lb

## 2021-08-19 DIAGNOSIS — F419 Anxiety disorder, unspecified: Secondary | ICD-10-CM | POA: Diagnosis not present

## 2021-08-19 DIAGNOSIS — M8580 Other specified disorders of bone density and structure, unspecified site: Secondary | ICD-10-CM

## 2021-08-19 DIAGNOSIS — K219 Gastro-esophageal reflux disease without esophagitis: Secondary | ICD-10-CM

## 2021-08-19 DIAGNOSIS — E039 Hypothyroidism, unspecified: Secondary | ICD-10-CM

## 2021-08-19 DIAGNOSIS — F3170 Bipolar disorder, currently in remission, most recent episode unspecified: Secondary | ICD-10-CM

## 2021-08-19 DIAGNOSIS — K5904 Chronic idiopathic constipation: Secondary | ICD-10-CM

## 2021-08-19 DIAGNOSIS — E781 Pure hyperglyceridemia: Secondary | ICD-10-CM | POA: Diagnosis not present

## 2021-08-19 DIAGNOSIS — F32A Depression, unspecified: Secondary | ICD-10-CM | POA: Diagnosis not present

## 2021-08-19 DIAGNOSIS — Z0000 Encounter for general adult medical examination without abnormal findings: Secondary | ICD-10-CM | POA: Diagnosis not present

## 2021-08-19 DIAGNOSIS — N1831 Chronic kidney disease, stage 3a: Secondary | ICD-10-CM | POA: Diagnosis not present

## 2021-08-19 DIAGNOSIS — J3089 Other allergic rhinitis: Secondary | ICD-10-CM

## 2021-08-19 DIAGNOSIS — K589 Irritable bowel syndrome without diarrhea: Secondary | ICD-10-CM

## 2021-08-19 LAB — POCT URINALYSIS DIPSTICK
Bilirubin, UA: NEGATIVE
Blood, UA: NEGATIVE
Glucose, UA: NEGATIVE
Ketones, UA: NEGATIVE
Leukocytes, UA: NEGATIVE
Nitrite, UA: NEGATIVE
Protein, UA: NEGATIVE
Spec Grav, UA: 1.015 (ref 1.010–1.025)
Urobilinogen, UA: 0.2 E.U./dL
pH, UA: 6.5 (ref 5.0–8.0)

## 2021-08-19 NOTE — Patient Instructions (Addendum)
Suggested Shingles vaccine this summer. Consider Cologard in August. Recheck lipids and TSH in August. Continue same dose of statin.
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
## 2021-08-19 NOTE — Progress Notes (Deleted)
° ° ° °  Annual Wellness Visit     Patient: [name] [name], Female    DOB: 02/07/1946, 76 y.o.   MRN: [id number] Visit Date: 08/19/2021  No chief complaint on file.  Subjective    [name] [name] is a 76 y.o. female who presents today for her Annual Wellness Visit.  HPI   Social History   Social History Narrative   Not on file    Patient Care Team: Baxley, Cresenciano Lick, MD as PCP - General  Review of Systems   Objective    Vitals: There were no vitals taken for this visit.  Physical Exam   Most recent functional status assessment: In your present state of health, do you have any difficulty performing the following activities: 08/19/2021  Hearing? N  Vision? N  Difficulty concentrating or making decisions? N  Walking or climbing stairs? N  Dressing or bathing? N  Doing errands, shopping? N  Preparing Food and eating ? N  Using the Toilet? N  In the past six months, have you accidently leaked urine? N  Do you have problems with loss of bowel control? N  Managing your Medications? N  Managing your Finances? N  Housekeeping or managing your Housekeeping? N  Some recent data might be hidden   Most recent fall risk assessment: Fall Risk  08/19/2021  Falls in the past year? 0  Comment -  Number falls in past yr: 0  Injury with Fall? 0  Risk for fall due to : No Fall Risks  Follow up Falls evaluation completed    Most recent depression screenings: PHQ 2/9 Scores 08/19/2021 08/17/2020  PHQ - 2 Score 0 0   Most recent cognitive screening: 6CIT Screen 08/19/2021  What Year? 0 points  What month? 0 points  What time? 0 points  Count back from 20 0 points  Months in reverse 0 points  Repeat phrase 4 points  Total Score 4       Assessment & Plan     Annual wellness visit done today including the all of the following: Reviewed patient's Family Medical History Reviewed and updated list of patient's medical providers Assessment of cognitive impairment was done Assessed  patient's functional ability Established a written schedule for health screening [place] Completed and Reviewed  Discussed health benefits of physical activity, and encouraged her to engage in regular exercise appropriate for her age and condition.         {provider attestation***:1}   Angus Seller, CMA

## 2021-08-26 ENCOUNTER — Encounter: Payer: Self-pay | Admitting: Internal Medicine

## 2021-09-08 ENCOUNTER — Encounter: Payer: Self-pay | Admitting: Internal Medicine

## 2021-09-11 ENCOUNTER — Telehealth: Payer: Self-pay | Admitting: Internal Medicine

## 2021-09-11 NOTE — Telephone Encounter (Signed)
[name] [name] [phone]  [name] called to say that [name] [name] said it was time for her Bone Density

## 2021-09-11 NOTE — Telephone Encounter (Signed)
After reviewing chart Bone Density is not due until 07/2022

## 2021-09-16 ENCOUNTER — Telehealth: Payer: Self-pay | Admitting: Internal Medicine

## 2021-09-16 NOTE — Telephone Encounter (Signed)
[name] [name] [phone]  [name] called wanting muscle relaxer for TMJ, I let her know she would need an appointment for that. She thought she could just call and get something because she was going to dentist today for it. I told her she could speak to the dentis about it and see if they could give her something if not, then she could call back and schedule an appointment. We can not just prescribe medication without documentation as to why they need it.

## 2021-09-17 ENCOUNTER — Encounter: Payer: Self-pay | Admitting: Internal Medicine

## 2021-10-10 ENCOUNTER — Other Ambulatory Visit: Payer: Self-pay

## 2021-10-10 ENCOUNTER — Telehealth: Payer: Self-pay | Admitting: Internal Medicine

## 2021-10-10 ENCOUNTER — Ambulatory Visit (INDEPENDENT_AMBULATORY_CARE_PROVIDER_SITE_OTHER): Payer: Medicare Other | Admitting: Internal Medicine

## 2021-10-10 ENCOUNTER — Encounter: Payer: Self-pay | Admitting: Internal Medicine

## 2021-10-10 VITALS — BP 140/90 | HR 89 | Temp 97.4°F | Ht 64.5 in | Wt 131.2 lb

## 2021-10-10 DIAGNOSIS — N1831 Chronic kidney disease, stage 3a: Secondary | ICD-10-CM

## 2021-10-10 DIAGNOSIS — R350 Frequency of micturition: Secondary | ICD-10-CM

## 2021-10-10 LAB — POCT URINALYSIS DIPSTICK
Bilirubin, UA: NEGATIVE
Blood, UA: NEGATIVE
Glucose, UA: NEGATIVE
Ketones, UA: NEGATIVE
Leukocytes, UA: NEGATIVE
Nitrite, UA: NEGATIVE
Protein, UA: NEGATIVE
Spec Grav, UA: <=1.005 — AB (ref 1.010–1.025)
Urobilinogen, UA: 0.2 E.U./dL
pH, UA: 7.5 (ref 5.0–8.0)

## 2021-10-10 LAB — BASIC METABOLIC PANEL
BUN/Creatinine Ratio: 11 (calc) (ref 6–22)
BUN: 14 mg/dL (ref 7–25)
CO2: 24 mmol/L (ref 20–32)
Calcium: 9.4 mg/dL (ref 8.6–10.4)
Chloride: 103 mmol/L (ref 98–110)
Creat: 1.23 mg/dL — ABNORMAL HIGH (ref 0.60–1.00)
Glucose, Bld: 106 mg/dL — ABNORMAL HIGH (ref 65–99)
Potassium: 4.1 mmol/L (ref 3.5–5.3)
Sodium: 140 mmol/L (ref 135–146)

## 2021-10-10 NOTE — Telephone Encounter (Signed)
[name] [name] ?[phone] ? ?[name] [name] called to say she has increased urination, I scheduled her to come in ar 4:00, she has dentist appointment at 3:00, so she may be running a little late.  ?

## 2021-10-10 NOTE — Patient Instructions (Signed)
Basic metabolic panel drawn by physician with results and recommendations to follow. No blood or white cells in urine. ?

## 2021-10-10 NOTE — Progress Notes (Signed)
76 year old Female seen regarding dark color of urine.Complaining of increased urination. History of chronic kidney disease, seen by Dr. Moshe Cipro at [facility]. ? ?History of depression treated by Dr. Casimiro Needle.  History of hypothyroidism and hyperlipidemia.  She is on statin medication.  She takes thyroid replacement medication and TSH recently drawn was stable. ? ?In November 2018 she had elevated serum creatinine.  Losartan was discontinued for hypertension and creatinine improved to 1.04 in December 2019.  Blood pressure readings have remained stable off losartan. ? ?Seen here for Medicare wellness visit and health maintenance exam in January and B-met showed creatinine of 1.35. In July 2022 creatinine was 1.25.Dr. Moshe Cipro saw her in June 2021 with dx of chronic kidney disease stage 3a. Creatinine then was 1.19. Patient has no UTI symptoms. No hematuria or dysuria. Has been told to take small dose NSAID for dental problem by daughter who is a Pharmacist, community [place] of state. Is to have an MRI soon regarding this issue. Has some TM joint dysfunction apparently, ? ?No CVA tenderness. Urine dipstick shows S.G. less than 1.005. has been drinking lots of fluids she says. No hematuria or LE. ? ?Basic metabolic panel drawn to day to look at BUN and creatinine. ? ?Suspect urine is merely concentrated today. B-met pending. No LE or nitirite. No blood. SG less than 1.005. ? ?Plan: Await B-met results. ?

## 2021-10-23 ENCOUNTER — Encounter: Payer: Self-pay | Admitting: Internal Medicine

## 2021-10-23 DIAGNOSIS — Z1231 Encounter for screening mammogram for malignant neoplasm of breast: Secondary | ICD-10-CM | POA: Diagnosis not present

## 2021-12-30 ENCOUNTER — Other Ambulatory Visit: Payer: Self-pay | Admitting: Oral Surgery

## 2021-12-30 DIAGNOSIS — M26623 Arthralgia of bilateral temporomandibular joint: Secondary | ICD-10-CM | POA: Diagnosis not present

## 2021-12-30 DIAGNOSIS — M26643 Arthritis of bilateral temporomandibular joint: Secondary | ICD-10-CM

## 2021-12-30 DIAGNOSIS — M26603 Bilateral temporomandibular joint disorder, unspecified: Secondary | ICD-10-CM | POA: Diagnosis not present

## 2021-12-30 NOTE — Progress Notes (Signed)
  Dec 30, 2021  [name] [name], [name] [name] is a 76 yo   Self ReferralTMJ evaluation.   DT:HYHOO jaw joint painful. Clicking, popping, grinding sound.  HPI: 51+ y  h/o TMJ dysfunction. Had nightguard fabricated 10+ years ago by Dr. Clovis Riley. No h/o locking.   Past Medical History: Kidney Disease, Osteopenia, Thyroid Disease, IBS, HTN, Bipolar disease, Depression  Medications: Neurontin, Doxepin, Synthroid, Lipitor, Nortryptaline, Boniva   Allergies: NSAIDs  Exam:   Mild intermittent clicking of the right and left TMJ. The maximum opening was 32 mm, 4 mm OJ, 4 mm OB with decreased range of left lateral range of motion. The occlusion was Class I. The muscles of mastication were non-tender to palpation. There was no crepitus present. Oral cancer screening negative. Pharynx clear. no lymphadenopathy  Pan: Condylar head flattening right and left TMJ. Slight bone spur anterior aspect of right condyle.  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   Plan: TMJ MRI. Discussed medications and inability to take NSAIDs due to kidney disease.  Recommend conservative treatment of   TMJ splint, ice, heat, and soft diet.   Rx: None  Gae Bon, DMD    [id number] Bilateral temporomandibular joint disorder, unspecified   M26.623 Arthralgia of bilateral temporomandibular joint

## 2021-12-31 ENCOUNTER — Other Ambulatory Visit: Payer: Self-pay | Admitting: Oral Surgery

## 2021-12-31 DIAGNOSIS — N189 Chronic kidney disease, unspecified: Secondary | ICD-10-CM

## 2022-01-01 ENCOUNTER — Telehealth: Payer: Self-pay | Admitting: Internal Medicine

## 2022-01-01 NOTE — Telephone Encounter (Signed)
[name] [phone]  [name] [name] called to let you know she had some x-rays for her TMJ and now she is getting MRI on 01/13/2022 with labs prior for her TMJ also.

## 2022-01-06 DIAGNOSIS — M2669 Other specified disorders of temporomandibular joint: Secondary | ICD-10-CM | POA: Diagnosis not present

## 2022-01-09 ENCOUNTER — Other Ambulatory Visit: Payer: Self-pay | Admitting: Internal Medicine

## 2022-01-13 ENCOUNTER — Ambulatory Visit (HOSPITAL_COMMUNITY)
Admission: RE | Admit: 2022-01-13 | Discharge: 2022-01-13 | Disposition: A | Discharge status: Home / Self Care | Payer: Medicare Other | Source: Ambulatory Visit | Attending: Oral Surgery | Admitting: Oral Surgery

## 2022-01-13 DIAGNOSIS — S0303XA Dislocation of jaw, bilateral, initial encounter: Secondary | ICD-10-CM | POA: Diagnosis not present

## 2022-01-13 DIAGNOSIS — M26643 Arthritis of bilateral temporomandibular joint: Secondary | ICD-10-CM | POA: Diagnosis not present

## 2022-01-13 DIAGNOSIS — M26623 Arthralgia of bilateral temporomandibular joint: Secondary | ICD-10-CM | POA: Diagnosis not present

## 2022-01-15 DIAGNOSIS — M26623 Arthralgia of bilateral temporomandibular joint: Secondary | ICD-10-CM | POA: Diagnosis not present

## 2022-01-15 DIAGNOSIS — M26603 Bilateral temporomandibular joint disorder, unspecified: Secondary | ICD-10-CM | POA: Diagnosis not present

## 2022-01-16 NOTE — Telephone Encounter (Signed)
Faxed information to Dr Hoyt Koch [phone]

## 2022-01-20 DIAGNOSIS — L738 Other specified follicular disorders: Secondary | ICD-10-CM | POA: Diagnosis not present

## 2022-01-20 DIAGNOSIS — D1801 Hemangioma of skin and subcutaneous tissue: Secondary | ICD-10-CM | POA: Diagnosis not present

## 2022-01-20 DIAGNOSIS — L570 Actinic keratosis: Secondary | ICD-10-CM | POA: Diagnosis not present

## 2022-01-20 DIAGNOSIS — L821 Other seborrheic keratosis: Secondary | ICD-10-CM | POA: Diagnosis not present

## 2022-01-20 DIAGNOSIS — Z85828 Personal history of other malignant neoplasm of skin: Secondary | ICD-10-CM | POA: Diagnosis not present

## 2022-01-20 DIAGNOSIS — D225 Melanocytic nevi of trunk: Secondary | ICD-10-CM | POA: Diagnosis not present

## 2022-01-29 ENCOUNTER — Encounter: Payer: Self-pay | Admitting: Internal Medicine

## 2022-01-30 ENCOUNTER — Encounter: Payer: Self-pay | Admitting: Internal Medicine

## 2022-02-08 ENCOUNTER — Encounter: Payer: Self-pay | Admitting: Internal Medicine

## 2022-02-18 ENCOUNTER — Other Ambulatory Visit: Payer: Medicare Other

## 2022-02-18 DIAGNOSIS — E039 Hypothyroidism, unspecified: Secondary | ICD-10-CM | POA: Diagnosis not present

## 2022-02-18 DIAGNOSIS — E781 Pure hyperglyceridemia: Secondary | ICD-10-CM

## 2022-02-19 DIAGNOSIS — M26623 Arthralgia of bilateral temporomandibular joint: Secondary | ICD-10-CM | POA: Diagnosis not present

## 2022-02-19 DIAGNOSIS — M26603 Bilateral temporomandibular joint disorder, unspecified: Secondary | ICD-10-CM | POA: Diagnosis not present

## 2022-02-19 LAB — BASIC METABOLIC PANEL
BUN/Creatinine Ratio: 12 (calc) (ref 6–22)
BUN: 16 mg/dL (ref 7–25)
CO2: 30 mmol/L (ref 20–32)
Calcium: 9.7 mg/dL (ref 8.6–10.4)
Chloride: 103 mmol/L (ref 98–110)
Creat: 1.29 mg/dL — ABNORMAL HIGH (ref 0.60–1.00)
Glucose, Bld: 86 mg/dL (ref 65–99)
Potassium: 4.7 mmol/L (ref 3.5–5.3)
Sodium: 140 mmol/L (ref 135–146)

## 2022-02-19 LAB — LIPID PANEL
Cholesterol: 145 mg/dL (ref <200)
HDL: 51 mg/dL (ref >=50)
LDL Cholesterol (Calc): 68 mg/dL (calc)
Non-HDL Cholesterol (Calc): 94 mg/dL (calc) (ref <130)
Total CHOL/HDL Ratio: 2.8 (calc) (ref <5.0)
Triglycerides: 188 mg/dL — ABNORMAL HIGH (ref <150)

## 2022-02-19 LAB — TSH: TSH: 2.11 mIU/L (ref 0.40–4.50)

## 2022-02-20 ENCOUNTER — Ambulatory Visit (INDEPENDENT_AMBULATORY_CARE_PROVIDER_SITE_OTHER): Payer: Medicare Other | Admitting: Internal Medicine

## 2022-02-20 ENCOUNTER — Encounter: Payer: Self-pay | Admitting: Internal Medicine

## 2022-02-20 VITALS — BP 128/70 | HR 86 | Temp 97.9°F | Ht 64.5 in | Wt 127.8 lb

## 2022-02-20 DIAGNOSIS — M26621 Arthralgia of right temporomandibular joint: Secondary | ICD-10-CM

## 2022-02-20 DIAGNOSIS — K5904 Chronic idiopathic constipation: Secondary | ICD-10-CM

## 2022-02-20 DIAGNOSIS — E781 Pure hyperglyceridemia: Secondary | ICD-10-CM | POA: Diagnosis not present

## 2022-02-20 DIAGNOSIS — Z8659 Personal history of other mental and behavioral disorders: Secondary | ICD-10-CM

## 2022-02-20 DIAGNOSIS — N1831 Chronic kidney disease, stage 3a: Secondary | ICD-10-CM

## 2022-02-20 DIAGNOSIS — E039 Hypothyroidism, unspecified: Secondary | ICD-10-CM

## 2022-02-20 NOTE — Progress Notes (Signed)
   Subjective:    Patient ID: [name] [name], female    DOB: 1945-09-19, 76 y.o.   MRN: [id number]  HPI 76 year old Female for 6 month follow up. Having oral surgery end of July. Having trouble chewing on right side.Having oral surgery by Dr. Hoyt Koch soon for right TMJ problem.  Says she is having considerable pain and problems eating.  Vaccines reviewed.  Had 1 Shingrix vaccine in June 2023.  Recommended COVID booster this Fall and recommend pneumococcal 20 vaccine after recovering from TMJ surgery.  Tetanus immunization due this year and can be with given at pharmacy if she is on Medicare.   History of hypothyroidism.  TSH checked on July 11 is stable at 2.11.  Currently on levothyroxine 25 mcg daily.  History of hyperlipidemia treated with atorvastatin.  I would like to see her triglyceride level improve.  Has been on atorvastatin 20 mg daily.  Recommend increasing to atorvastatin 40 mg daily.  In January 2023 triglycerides were 203 and are now 188.  History of elevated serum creatinine followed at [facility].  Currently creatinine is 1.29 and is stable.  Last seen by [facility] in June 2021.  Their records indicate she has stage IIIa chronic kidney disease.  Had renal ultrasound in 2021 showing mild diffuse cortical thinning without hydronephrosis.  Records from Dr. Beverely Pace indicate patient had age-related nephrosclerosis with history of NSAID use.  Patient was described as having very early stage stage III chronic kidney disease with GFR just under 60 cc/min and Dr. Phoebe Perch was not concerned about this value.  She felt patient had good prognosis and minimal risk factors.  She felt NSAIDs can be used very judiciously if needed.  Dr. Moshe Cipro did not schedule return appointment.   Review of Systems see above     Objective:   Physical Exam Blood pressure 128/70 pulse 86 temperature 97.9 degrees pulse oximetry 98% weight 127 pounds 12 ounces BMI 21.59  Skin:  Warm and dry.  Nodes none.  TMs clear.  Pharynx clear.  Neck supple.  No thyromegaly or carotid bruits.  Chest clear to auscultation without rales or wheezing.  Cardiac exam: Regular rate and rhythm without ectopy or murmurs.  No lower extremity pitting edema.  Affect thought and judgment appear to be normal.       Assessment & Plan:  Hypothyroidism-stable on thyroid replacement medication  Need for right TMJ surgery-to have this in the near future with Dr. Hoyt Koch  Elevated serum creatinine consistent with chronic kidney disease stage IIIa and stable.  Has seen Dr. Moshe Cipro in 2021  Need for update on several vaccines.  Patient may check with pharmacy since she is on Medicare.  These include tetanus update, COVID vaccine recommended in the Fall, pneumococcal 20 vaccine and second Shingrix vaccine.  Only 1 Shingrix vaccine on file in our records.  Flu vaccine recommended in the Fall as well.  Recommend only getting 1 vaccine at a time.  Hyperlipidemia-increase atorvastatin to 40 mg daily.  Health maintenance exam and Medicare wellness visit due January 2024  Anxiety and depression treated with Pamelor 75 mg at bedtime and doxepin 25 mg daily.

## 2022-03-05 NOTE — Patient Instructions (Signed)
Good luck with TMJ surgery.  Labs are stable.  Vaccines need up-to-date.  Please see assessment and plan regarding those that need to be obtained at local pharmacy.  Increase atorvastatin to 40 mg daily and follow-up at time of health maintenance exam and Medicare wellness visit which is due January 2024.  It was good to see you today.

## 2022-03-06 DIAGNOSIS — M26623 Arthralgia of bilateral temporomandibular joint: Secondary | ICD-10-CM | POA: Diagnosis not present

## 2022-03-06 DIAGNOSIS — M26603 Bilateral temporomandibular joint disorder, unspecified: Secondary | ICD-10-CM | POA: Diagnosis not present

## 2022-03-20 ENCOUNTER — Telehealth: Payer: Self-pay | Admitting: Internal Medicine

## 2022-03-20 ENCOUNTER — Encounter: Payer: Self-pay | Admitting: Internal Medicine

## 2022-03-20 ENCOUNTER — Telehealth (INDEPENDENT_AMBULATORY_CARE_PROVIDER_SITE_OTHER): Payer: Medicare Other | Admitting: Internal Medicine

## 2022-03-20 VITALS — BP 105/67 | Temp 100.0°F | Wt 129.0 lb

## 2022-03-20 DIAGNOSIS — N1831 Chronic kidney disease, stage 3a: Secondary | ICD-10-CM

## 2022-03-20 DIAGNOSIS — Z8659 Personal history of other mental and behavioral disorders: Secondary | ICD-10-CM | POA: Diagnosis not present

## 2022-03-20 DIAGNOSIS — U071 COVID-19: Secondary | ICD-10-CM | POA: Diagnosis not present

## 2022-03-20 DIAGNOSIS — E039 Hypothyroidism, unspecified: Secondary | ICD-10-CM | POA: Diagnosis not present

## 2022-03-20 NOTE — Progress Notes (Signed)
   Subjective:    Patient ID: [name] [name], female    DOB: [birth year], 76 y.o.   MRN: [id number]  HPI 76 year old Female seen today via interactive audio and video telecommunications.  She is identified using 2 identifiers as [name] [name]. Pilar Plate, a patient in this practice.  She is at her home and I am at my office.  She is agreeable to visit in this format today.  Patient tells me that she has tested positive for COVID-19 today.  She is alone in her home.  Says symptoms started on Monday, August 7.  Has had some sore throat myalgias stuffy nose and fatigue.  No fever.  No cough.  Tells me that she really does not feel all that bad and has been well-hydrated.  Does not have pulse oximeter to check oxygen saturations.  Explained to her she needs to quarantine for 5 days at home.  Her general health is good.  She has mild chronic kidney disease stage IIIa and is followed by Dr. Moshe Cipro at [facility].  History of depression treated by Dr. Casimiro Needle.  History of hypothyroidism and hyperlipidemia.    Review of Systems no complaint of headache, nausea, vomiting, diarrhea, chills.  Main complaint is fatigue.     Objective:   Physical Exam  She is seen virtually in no acute distress.  She is smiling and looks well. She is not coughing.  Has some  fatigue.      Assessment & Plan:  Acute COVID-19 virus infection  Plan: It is too late to start Paxlovid at this point.  She became symptomatic on Monday, August 7.  She did not realize until today that she could possibly have COVID-19 and took a home test which was positive.  She does not feel like she needs any other medications called in as she is not nauseated and is not coughing at the present time.  She was advised to stay well-hydrated.  She was advised to walk some about her home to prevent atelectasis and to quarantine at home for total of 5 days.  She will call back if she has any concerns whatsoever.  Time spent with patient with  virtual visit which includes reviewing medical records, shared decision making during the visit regarding treatment options which were discussed is 20 minutes.

## 2022-03-20 NOTE — Telephone Encounter (Signed)
Haily Caley [phone]  [name] called to say she just tested positive for COVID this morning, she started getting sick on Monday with sore throat, body aches, stuffy nose, and very tired. No cough, No fever. I set her up for video visit at 12:00 today.

## 2022-03-20 NOTE — Patient Instructions (Addendum)
Patient seen today for acute COVID-19.  Was advised to stay well-hydrated.  Is advised to walk some around her home to prevent atelectasis.  She has mild symptoms.  She has been symptomatic for several days so it is likely too late to start Paxlovid.  She has chronic kidney disease stage IIIa and would need a renal dose of Paxlovid.  However it is too late to start this medication.  She will stay well-hydrated and walk some to prevent atelectasis and monitor her symptoms.  She will call back if she has any concerns whatsoever.  Was advised that fatigue could be present for up to 2 weeks.

## 2022-04-06 ENCOUNTER — Other Ambulatory Visit: Payer: Self-pay | Admitting: Internal Medicine

## 2022-04-07 ENCOUNTER — Other Ambulatory Visit: Payer: Self-pay | Admitting: Internal Medicine

## 2022-04-07 ENCOUNTER — Encounter: Payer: Self-pay | Admitting: Internal Medicine

## 2022-04-28 ENCOUNTER — Encounter: Payer: Self-pay | Admitting: Internal Medicine

## 2022-04-28 ENCOUNTER — Other Ambulatory Visit: Payer: Self-pay | Admitting: Internal Medicine

## 2022-05-09 ENCOUNTER — Ambulatory Visit (INDEPENDENT_AMBULATORY_CARE_PROVIDER_SITE_OTHER): Payer: Medicare Other

## 2022-05-09 VITALS — BP 106/70 | Temp 97.8°F

## 2022-05-09 DIAGNOSIS — Z23 Encounter for immunization: Secondary | ICD-10-CM

## 2022-05-23 DIAGNOSIS — D1801 Hemangioma of skin and subcutaneous tissue: Secondary | ICD-10-CM | POA: Diagnosis not present

## 2022-05-23 DIAGNOSIS — Z85828 Personal history of other malignant neoplasm of skin: Secondary | ICD-10-CM | POA: Diagnosis not present

## 2022-05-23 DIAGNOSIS — L820 Inflamed seborrheic keratosis: Secondary | ICD-10-CM | POA: Diagnosis not present

## 2022-05-27 DIAGNOSIS — Z79899 Other long term (current) drug therapy: Secondary | ICD-10-CM | POA: Diagnosis not present

## 2022-05-27 DIAGNOSIS — K5909 Other constipation: Secondary | ICD-10-CM | POA: Diagnosis not present

## 2022-05-27 DIAGNOSIS — K638219 Small intestinal bacterial overgrowth, unspecified: Secondary | ICD-10-CM | POA: Diagnosis not present

## 2022-05-27 DIAGNOSIS — Z8719 Personal history of other diseases of the digestive system: Secondary | ICD-10-CM | POA: Diagnosis not present

## 2022-06-04 DIAGNOSIS — H524 Presbyopia: Secondary | ICD-10-CM | POA: Diagnosis not present

## 2022-06-04 DIAGNOSIS — H52223 Regular astigmatism, bilateral: Secondary | ICD-10-CM | POA: Diagnosis not present

## 2022-06-04 DIAGNOSIS — Z9849 Cataract extraction status, unspecified eye: Secondary | ICD-10-CM | POA: Diagnosis not present

## 2022-06-04 DIAGNOSIS — H53143 Visual discomfort, bilateral: Secondary | ICD-10-CM | POA: Diagnosis not present

## 2022-06-04 DIAGNOSIS — H5203 Hypermetropia, bilateral: Secondary | ICD-10-CM | POA: Diagnosis not present

## 2022-06-04 DIAGNOSIS — Z961 Presence of intraocular lens: Secondary | ICD-10-CM | POA: Diagnosis not present

## 2022-06-04 DIAGNOSIS — I10 Essential (primary) hypertension: Secondary | ICD-10-CM | POA: Diagnosis not present

## 2022-06-04 DIAGNOSIS — H35033 Hypertensive retinopathy, bilateral: Secondary | ICD-10-CM | POA: Diagnosis not present

## 2022-06-09 DIAGNOSIS — M25511 Pain in right shoulder: Secondary | ICD-10-CM | POA: Diagnosis not present

## 2022-06-09 DIAGNOSIS — M67911 Unspecified disorder of synovium and tendon, right shoulder: Secondary | ICD-10-CM | POA: Diagnosis not present

## 2022-07-06 ENCOUNTER — Other Ambulatory Visit: Payer: Self-pay | Admitting: Internal Medicine

## 2022-07-17 ENCOUNTER — Encounter: Payer: Self-pay | Admitting: Internal Medicine

## 2022-07-29 DIAGNOSIS — M7541 Impingement syndrome of right shoulder: Secondary | ICD-10-CM | POA: Diagnosis not present

## 2022-07-31 DIAGNOSIS — M7541 Impingement syndrome of right shoulder: Secondary | ICD-10-CM | POA: Diagnosis not present

## 2022-08-07 DIAGNOSIS — M7541 Impingement syndrome of right shoulder: Secondary | ICD-10-CM | POA: Diagnosis not present

## 2022-08-13 DIAGNOSIS — M7541 Impingement syndrome of right shoulder: Secondary | ICD-10-CM | POA: Diagnosis not present

## 2022-08-14 ENCOUNTER — Telehealth: Payer: Self-pay | Admitting: Internal Medicine

## 2022-08-14 DIAGNOSIS — Z23 Encounter for immunization: Secondary | ICD-10-CM | POA: Diagnosis not present

## 2022-08-14 NOTE — Telephone Encounter (Signed)
Left detailed message informing patient she is due for prevnar 20 and she can call the office to schedule an appt or go to her local pharmacy

## 2022-08-14 NOTE — Telephone Encounter (Signed)
[name] [name] called to inquire if she has had her PNA shot. Please call her back to let her know.

## 2022-08-15 DIAGNOSIS — M7541 Impingement syndrome of right shoulder: Secondary | ICD-10-CM | POA: Diagnosis not present

## 2022-08-21 DIAGNOSIS — M7541 Impingement syndrome of right shoulder: Secondary | ICD-10-CM | POA: Diagnosis not present

## 2022-08-22 ENCOUNTER — Encounter: Payer: Self-pay | Admitting: Internal Medicine

## 2022-08-26 DIAGNOSIS — M7541 Impingement syndrome of right shoulder: Secondary | ICD-10-CM | POA: Diagnosis not present

## 2022-08-28 ENCOUNTER — Other Ambulatory Visit: Payer: Medicare Other

## 2022-08-28 DIAGNOSIS — E039 Hypothyroidism, unspecified: Secondary | ICD-10-CM

## 2022-08-28 DIAGNOSIS — I10 Essential (primary) hypertension: Secondary | ICD-10-CM

## 2022-08-28 DIAGNOSIS — M7541 Impingement syndrome of right shoulder: Secondary | ICD-10-CM | POA: Diagnosis not present

## 2022-08-28 DIAGNOSIS — Z13220 Encounter for screening for lipoid disorders: Secondary | ICD-10-CM | POA: Diagnosis not present

## 2022-08-28 IMAGING — MR MR [PERSON_NAME]
9 series · 16 of 16 positions shown · non-contrast
Comparison: None Available.

CLINICAL DATA: TMJ Arthralgia bilateral

EXAM:
MRI OF TEMPOROMANDIBULAR JOINT WITHOUT CONTRAST
TECHNIQUE: Multiplanar, multisequence MR imaging of the temporomandibular joint
was performed following the standard protocol. No intravenous
contrast was administered.

[Series 6: T1 · axial · 3.0mm · 0.45mm/px · z∈[-135,+12]mm · 3 of 50 slices shown]
[im 1/50]
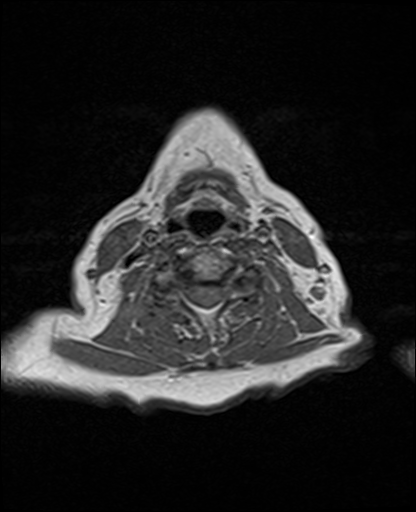
[im 25/50]
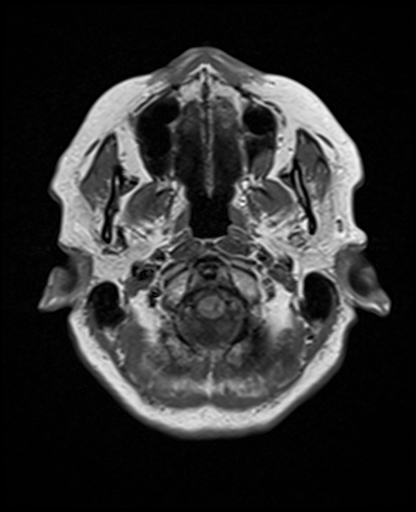
[im 50/50]
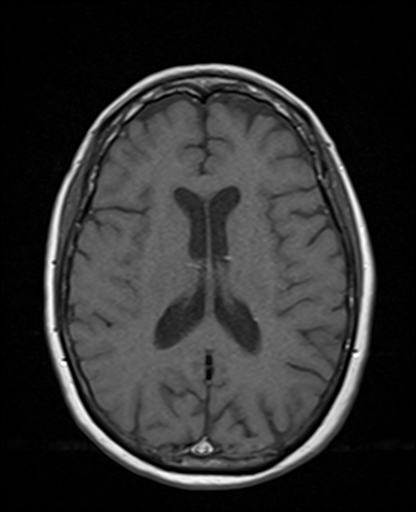

[Series 7: PD · coronal · 4.0mm · 0.59mm/px · 1 of 12 slices shown (1 of 7)]
[im 1/12]
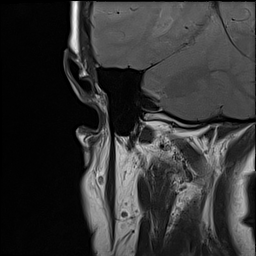

[Series 8: PD · coronal · 4.0mm · 0.59mm/px · 1 of 12 slices shown (2 of 7)]
[im 1/12]
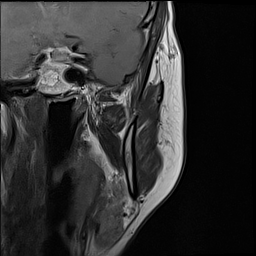

[Series 9: T2 fat-sat · sagittal · 4.0mm · 0.59mm/px · 1 of 24 slices shown]
[im 1/24]
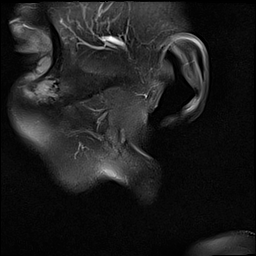

[Series 10: PD · sagittal · 4.0mm · 0.59mm/px · 2 of 24 slices shown (3 of 7)]
[im 1/24]
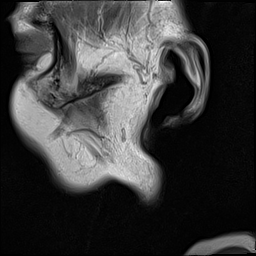
[im 24/24]
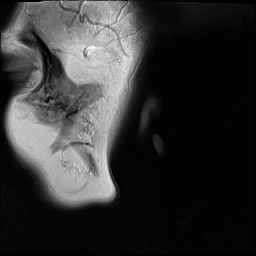

[Series 11: PD · sagittal · 4.0mm · 0.59mm/px · 2 of 24 slices shown (4 of 7)]
[im 1/24]
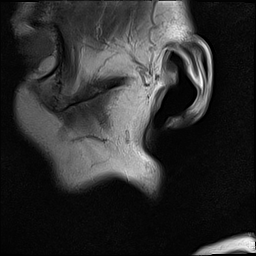
[im 24/24]
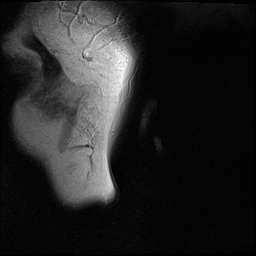

[Series 12: PD · sagittal · 4.0mm · 0.59mm/px · 2 of 24 slices shown (5 of 7)]
[im 1/24]
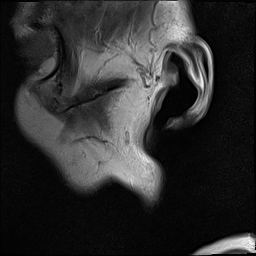
[im 24/24]
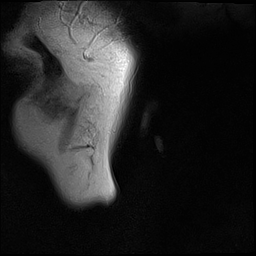

[Series 13: PD · sagittal · 4.0mm · 0.59mm/px · 2 of 24 slices shown (6 of 7)]
[im 1/24]
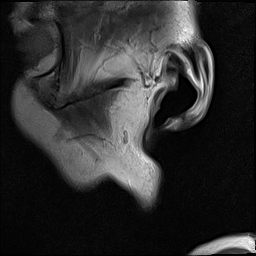
[im 24/24]
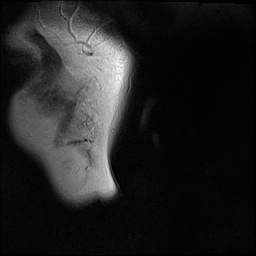

[Series 14: PD · sagittal · 4.0mm · 0.59mm/px · 2 of 24 slices shown (7 of 7)]
[im 1/24]
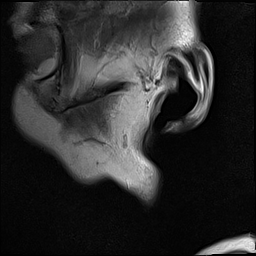
[im 24/24]
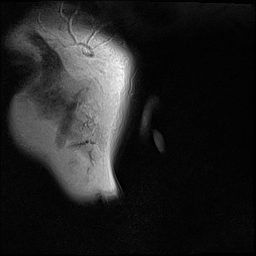

[16 of 16 positions shown; findings below may reference images not displayed]

FINDINGS: Right temporomandibular joint: There is anterior displacement of the
TMJ articular disc in the closed position. The disc appears mildly
degenerated.There is limited translation of the mandibular condyle
upon jaw opening, and without recapture of the disc.There is mild
TMJ osteoarthritis. Small joint effusion.

Left temporomandibular joint: There is anterior displacement of the
TMJ articular disc in the closed position. The disc appears mildly
degenerated. There is limited anterior translation of the mandibular
condyle upon opening and without recapture of the disc. There is
mild left TMJ osteoarthritis. There is no joint effusion.

Other: Mild mucosal thickening in the left maxillary sinus.
IMPRESSION: Right TMJ: Degenerated and anteriorly displaced TMJ articular disc
in the closed position. Limited translation of the mandibular
condyle upon opening without recapture. Small right TMJ joint
effusion.

Left TMJ: Degenerated and anteriorly displaced TMJ articular disc in
the closed position. Limited translation of the mandibular condyle
upon opening without recapture.

## 2022-08-29 DIAGNOSIS — Z23 Encounter for immunization: Secondary | ICD-10-CM | POA: Diagnosis not present

## 2022-08-29 LAB — CBC WITH DIFFERENTIAL/PLATELET
Absolute Monocytes: 385 cells/uL (ref 200–950)
Basophils Absolute: 30 cells/uL (ref 0–200)
Basophils Relative: 0.6 %
Eosinophils Absolute: 110 cells/uL (ref 15–500)
Eosinophils Relative: 2.2 %
HCT: 43.3 % (ref 35.0–45.0)
Hemoglobin: 14.6 g/dL (ref 11.7–15.5)
Lymphs Abs: 1705 cells/uL (ref 850–3900)
MCH: 31.8 pg (ref 27.0–33.0)
MCHC: 33.7 g/dL (ref 32.0–36.0)
MCV: 94.3 fL (ref 80.0–100.0)
MPV: 9 fL (ref 7.5–12.5)
Monocytes Relative: 7.7 %
Neutro Abs: 2770 cells/uL (ref 1500–7800)
Neutrophils Relative %: 55.4 %
Platelets: 210 10*3/uL (ref 140–400)
RBC: 4.59 10*6/uL (ref 3.80–5.10)
RDW: 11.6 % (ref 11.0–15.0)
Total Lymphocyte: 34.1 %
WBC: 5 10*3/uL (ref 3.8–10.8)

## 2022-08-29 LAB — COMPLETE METABOLIC PANEL WITH GFR
AG Ratio: 1.6 (calc) (ref 1.0–2.5)
ALT: 16 U/L (ref 6–29)
AST: 22 U/L (ref 10–35)
Albumin: 4.6 g/dL (ref 3.6–5.1)
Alkaline phosphatase (APISO): 58 U/L (ref 37–153)
BUN/Creatinine Ratio: 15 (calc) (ref 6–22)
BUN: 19 mg/dL (ref 7–25)
CO2: 25 mmol/L (ref 20–32)
Calcium: 10.4 mg/dL (ref 8.6–10.4)
Chloride: 104 mmol/L (ref 98–110)
Creat: 1.26 mg/dL — ABNORMAL HIGH (ref 0.60–1.00)
Globulin: 2.8 g/dL (calc) (ref 1.9–3.7)
Glucose, Bld: 92 mg/dL (ref 65–99)
Potassium: 4.6 mmol/L (ref 3.5–5.3)
Sodium: 143 mmol/L (ref 135–146)
Total Bilirubin: 0.6 mg/dL (ref 0.2–1.2)
Total Protein: 7.4 g/dL (ref 6.1–8.1)
eGFR: 44 mL/min/{1.73_m2} — ABNORMAL LOW (ref >=60)

## 2022-08-29 LAB — LIPID PANEL
Cholesterol: 118 mg/dL (ref <200)
HDL: 59 mg/dL (ref >=50)
LDL Cholesterol (Calc): 38 mg/dL (calc)
Non-HDL Cholesterol (Calc): 59 mg/dL (calc) (ref <130)
Total CHOL/HDL Ratio: 2 (calc) (ref <5.0)
Triglycerides: 131 mg/dL (ref <150)

## 2022-08-29 LAB — TSH: TSH: 2.03 mIU/L (ref 0.40–4.50)

## 2022-09-02 DIAGNOSIS — M7541 Impingement syndrome of right shoulder: Secondary | ICD-10-CM | POA: Diagnosis not present

## 2022-09-04 ENCOUNTER — Encounter: Payer: Self-pay | Admitting: Internal Medicine

## 2022-09-04 ENCOUNTER — Ambulatory Visit (INDEPENDENT_AMBULATORY_CARE_PROVIDER_SITE_OTHER): Payer: Medicare Other | Admitting: Internal Medicine

## 2022-09-04 VITALS — BP 120/66 | HR 81 | Temp 98.1°F | Ht 64.0 in | Wt 120.0 lb

## 2022-09-04 DIAGNOSIS — F419 Anxiety disorder, unspecified: Secondary | ICD-10-CM | POA: Diagnosis not present

## 2022-09-04 DIAGNOSIS — K219 Gastro-esophageal reflux disease without esophagitis: Secondary | ICD-10-CM

## 2022-09-04 DIAGNOSIS — Z0000 Encounter for general adult medical examination without abnormal findings: Secondary | ICD-10-CM

## 2022-09-04 DIAGNOSIS — Z8659 Personal history of other mental and behavioral disorders: Secondary | ICD-10-CM

## 2022-09-04 DIAGNOSIS — Z1211 Encounter for screening for malignant neoplasm of colon: Secondary | ICD-10-CM | POA: Diagnosis not present

## 2022-09-04 DIAGNOSIS — M8580 Other specified disorders of bone density and structure, unspecified site: Secondary | ICD-10-CM | POA: Diagnosis not present

## 2022-09-04 DIAGNOSIS — J3089 Other allergic rhinitis: Secondary | ICD-10-CM | POA: Diagnosis not present

## 2022-09-04 DIAGNOSIS — F32A Depression, unspecified: Secondary | ICD-10-CM | POA: Diagnosis not present

## 2022-09-04 DIAGNOSIS — I252 Old myocardial infarction: Secondary | ICD-10-CM

## 2022-09-04 DIAGNOSIS — E781 Pure hyperglyceridemia: Secondary | ICD-10-CM | POA: Diagnosis not present

## 2022-09-04 DIAGNOSIS — M25511 Pain in right shoulder: Secondary | ICD-10-CM | POA: Diagnosis not present

## 2022-09-04 DIAGNOSIS — M7541 Impingement syndrome of right shoulder: Secondary | ICD-10-CM | POA: Diagnosis not present

## 2022-09-04 DIAGNOSIS — N1831 Chronic kidney disease, stage 3a: Secondary | ICD-10-CM

## 2022-09-04 DIAGNOSIS — E039 Hypothyroidism, unspecified: Secondary | ICD-10-CM | POA: Diagnosis not present

## 2022-09-04 LAB — POCT URINALYSIS DIPSTICK
Bilirubin, UA: NEGATIVE
Blood, UA: NEGATIVE
Glucose, UA: NEGATIVE
Ketones, UA: NEGATIVE
Leukocytes, UA: NEGATIVE
Nitrite, UA: NEGATIVE
Protein, UA: NEGATIVE
Spec Grav, UA: 1.01
Urobilinogen, UA: 0.2 U/dL
pH, UA: 7
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
## 2022-09-08 ENCOUNTER — Other Ambulatory Visit: Payer: Self-pay

## 2022-09-08 DIAGNOSIS — Z1211 Encounter for screening for malignant neoplasm of colon: Secondary | ICD-10-CM

## 2022-09-08 DIAGNOSIS — M26603 Bilateral temporomandibular joint disorder, unspecified: Secondary | ICD-10-CM | POA: Diagnosis not present

## 2022-09-08 DIAGNOSIS — M26623 Arthralgia of bilateral temporomandibular joint: Secondary | ICD-10-CM | POA: Diagnosis not present

## 2022-09-09 DIAGNOSIS — M7541 Impingement syndrome of right shoulder: Secondary | ICD-10-CM | POA: Diagnosis not present

## 2022-09-10 DIAGNOSIS — Z1211 Encounter for screening for malignant neoplasm of colon: Secondary | ICD-10-CM | POA: Diagnosis not present

## 2022-09-11 DIAGNOSIS — M7541 Impingement syndrome of right shoulder: Secondary | ICD-10-CM | POA: Diagnosis not present

## 2022-09-16 DIAGNOSIS — M7541 Impingement syndrome of right shoulder: Secondary | ICD-10-CM | POA: Diagnosis not present

## 2022-09-18 DIAGNOSIS — M7541 Impingement syndrome of right shoulder: Secondary | ICD-10-CM | POA: Diagnosis not present

## 2022-09-21 LAB — COLOGUARD: COLOGUARD: NEGATIVE

## 2022-09-22 DIAGNOSIS — M7541 Impingement syndrome of right shoulder: Secondary | ICD-10-CM | POA: Diagnosis not present

## 2022-09-29 DIAGNOSIS — M25511 Pain in right shoulder: Secondary | ICD-10-CM | POA: Diagnosis not present

## 2022-10-01 DIAGNOSIS — M26603 Bilateral temporomandibular joint disorder, unspecified: Secondary | ICD-10-CM | POA: Diagnosis not present

## 2022-10-01 DIAGNOSIS — M26623 Arthralgia of bilateral temporomandibular joint: Secondary | ICD-10-CM | POA: Diagnosis not present

## 2022-10-05 ENCOUNTER — Other Ambulatory Visit: Payer: Self-pay | Admitting: Internal Medicine

## 2022-10-08 DIAGNOSIS — M75121 Complete rotator cuff tear or rupture of right shoulder, not specified as traumatic: Secondary | ICD-10-CM | POA: Diagnosis not present

## 2022-10-29 DIAGNOSIS — Z1231 Encounter for screening mammogram for malignant neoplasm of breast: Secondary | ICD-10-CM | POA: Diagnosis not present

## 2022-10-29 LAB — HM MAMMOGRAPHY

## 2022-10-30 ENCOUNTER — Encounter: Payer: Self-pay | Admitting: Internal Medicine

## 2022-12-11 DIAGNOSIS — K5909 Other constipation: Secondary | ICD-10-CM | POA: Diagnosis not present

## 2022-12-12 DIAGNOSIS — M79674 Pain in right toe(s): Secondary | ICD-10-CM | POA: Diagnosis not present

## 2022-12-12 DIAGNOSIS — M79675 Pain in left toe(s): Secondary | ICD-10-CM | POA: Diagnosis not present

## 2022-12-22 DIAGNOSIS — M6748 Ganglion, other site: Secondary | ICD-10-CM | POA: Diagnosis not present

## 2022-12-22 DIAGNOSIS — Z85828 Personal history of other malignant neoplasm of skin: Secondary | ICD-10-CM | POA: Diagnosis not present

## 2022-12-22 DIAGNOSIS — D485 Neoplasm of uncertain behavior of skin: Secondary | ICD-10-CM | POA: Diagnosis not present

## 2023-01-06 ENCOUNTER — Other Ambulatory Visit: Payer: Self-pay | Admitting: *Deleted

## 2023-01-06 DIAGNOSIS — M79606 Pain in leg, unspecified: Secondary | ICD-10-CM

## 2023-01-12 NOTE — Progress Notes (Unsigned)
[facility] [place]  ASSESSMENT / PLAN: 77 y.o. female with normal clinical exam and non-invasive testing. No evidence of peripheral arterial disease. Patient may follow up with me on an as needed basis.   CHIEF COMPLAINT: cold feet  HISTORY OF PRESENT ILLNESS: [name] [name] is a 77 y.o. female who presents to clinic for evaluation of possible peripheral arterial disease.  Her son is a Visual merchandiser.  The patient reported cold feeling toes and toe abnormalities to orthopedist who suggested vascular surgery evaluation.  On my evaluation, the patient reports good exercise tolerance.  She can walk as far as fast as she likes.  She does not describe symptoms of rest pain.  She has no ulcers about her feet.  Her only complaint is a inwardly deviated fourth toe on the right foot.  Past Medical History:  Diagnosis Date   Anxiety state, unspecified    Bilateral shoulder pain    Bipolar disorder, unspecified ([facility])    Depressive disorder, not elsewhere classified    Diarrhea    Esophageal reflux    Hyperlipidemia    Hypertension    Irritable bowel syndrome    Personal history of colonic polyps 2005   hyperplastic   Thyroid disease    Unspecified hemorrhoids without mention of complication     Past Surgical History:  Procedure Laterality Date   COLONOSCOPY     ESOPHAGOGASTRODUODENOSCOPY      Family History  Problem Relation Age of Onset   Kidney disease Brother    Heart disease Father    Arthritis Mother    Colon cancer Neg Hx     Social History   Socioeconomic History   Marital status: Divorced    Spouse name: Not on file   Number of children: 2   Years of education: Not on file   Highest education level: Not on file  Occupational History   Occupation: Retired  Tobacco Use   Smoking status: Never   Smokeless tobacco: Never  Substance and Sexual Activity   Alcohol use: No   Drug use: No   Sexual activity: Not on  file  Other Topics Concern   Not on file  Social History Narrative   Social history: Does not smoke or consume alcohol.  Resides alone.  Is divorced.  Used to work as a Runner, broadcasting/film/video.  Used to sing in the choir at [employer] [facility] [employer] but since [place] came about she has not been doing that.  Completed 4 years of college.       Family history: Mother with history of "nervous breakdown".  Father with history of hypertension.  Brother with hypertension.  1 brother apparently died with end-stage kidney disease and was on lithium with history of alcohol abuse.       Social Determinants of Health   Financial Resource Strain: Not on file  Food Insecurity: Not on file  Transportation Needs: Not on file  Physical Activity: Not on file  Stress: Not on file  Social Connections: Not on file  Intimate Partner Violence: Not on file    No Known Allergies  Current Outpatient Medications  Medication Sig Dispense Refill   atorvastatin (LIPITOR) 40 MG tablet TAKE 1 TABLET BY MOUTH EVERY DAY 90 tablet 3   calcium carbonate (OS-CAL) 600 MG TABS Take 300 mg by mouth daily with breakfast. With D3     cholecalciferol (VITAMIN D) 1000 UNITS tablet Take 1,000 Units by mouth daily.  doxepin (SINEQUAN) 25 MG capsule Take 25 mg by mouth daily.     gabapentin (NEURONTIN) 400 MG capsule Take 800 mg by mouth at bedtime.     levothyroxine (SYNTHROID) 25 MCG tablet TAKE 1 TABLET BY MOUTH EVERY DAY BEFORE BREAKFAST 90 tablet 0   metroNIDAZOLE (FLAGYL) 500 MG tablet Take 500 mg by mouth 3 (three) times daily.     nortriptyline (PAMELOR) 75 MG capsule Take 75 mg by mouth at bedtime.     valACYclovir (VALTREX) 500 MG tablet TAKE 1 TABLET BY MOUTH TWICE A DAY FOR 5 DAYS 10 tablet PRN   No current facility-administered medications for this visit.    PHYSICAL EXAM Vitals:   01/13/23 1354  BP: 124/77  Pulse: 81  Resp: 20  Temp: 97.9 F (36.6 C)  SpO2: 100%  Weight: 118 lb (53.5 kg)  Height: 5\' 4"  (1.626 m)     Well-appearing elderly woman in no acute distress Regular rate and rhythm Unlabored breathing 2+ pedal pulses bilaterally.  PERTINENT LABORATORY AND RADIOLOGIC DATA  Most recent CBC    Latest Ref Rng & Units 08/28/2022    9:26 AM 08/16/2021   10:02 AM 02/22/2021    9:21 AM  CBC  WBC 3.8 - 10.8 Thousand/uL 5.0  5.2  5.0   Hemoglobin 11.7 - 15.5 g/dL 16.1  09.6  04.5   Hematocrit 35.0 - 45.0 % 43.3  45.5  43.8   Platelets 140 - 400 Thousand/uL 210  232  214      Most recent CMP    Latest Ref Rng & Units 08/28/2022    9:26 AM 02/18/2022   12:08 PM 10/10/2021    4:35 PM  CMP  Glucose 65 - 99 mg/dL 92  86  409   BUN 7 - 25 mg/dL 19  16  14    Creatinine 0.60 - 1.00 mg/dL 8.11  9.14  7.82   Sodium 135 - 146 mmol/L 143  140  140   Potassium 3.5 - 5.3 mmol/L 4.6  4.7  4.1   Chloride 98 - 110 mmol/L 104  103  103   CO2 20 - 32 mmol/L 25  30  24    Calcium 8.6 - 10.4 mg/dL 95.6  9.7  9.4   Total Protein 6.1 - 8.1 g/dL 7.4     Total Bilirubin 0.2 - 1.2 mg/dL 0.6     AST 10 - 35 U/L 22     ALT 6 - 29 U/L 16       Renal function CrCl cannot be calculated (Patient's most recent lab result is older than the maximum 21 days allowed.).  No results found for: "HGBA1C"  LDL Cholesterol (Calc)  Date Value Ref Range Status  08/28/2022 38 mg/dL (calc) Final    Comment:    Reference range: <100 . Desirable range <100 mg/dL for primary prevention;   <70 mg/dL for patients with CHD or diabetic patients  with > or = 2 CHD risk factors. [name] LDL-C is now calculated using the Martin-Hopkins  calculation, which is a validated novel method providing  better accuracy than the Friedewald equation in the  estimation of LDL-C.  Horald Pollen et al. Lenox Ahr. 2130;865(78): 2061-2068  (http://education.[facility].com/faq/FAQ164)      +-------+-----------+-----------+------------+------------+  ABI/TBIToday's ABIToday's TBIPrevious ABIPrevious TBI   +-------+-----------+-----------+------------+------------+  Right 1.20       0.86                                 +-------+-----------+-----------+------------+------------+  Left  1.10       0.73                                 +-------+-----------+-----------+------------+------------+     Rande Brunt. Lenell Antu, MD FACS [facility] Phone Number: [phone] 01/13/2023 4:43 PM   Total time spent on preparing this encounter including chart review, data review, collecting history, examining the patient, coordinating care for this new patient, 45 minutes.  Portions of this report may have been transcribed using voice recognition software.  Every effort has been made to ensure accuracy; however, inadvertent computerized transcription errors may still be present.

## 2023-01-13 ENCOUNTER — Ambulatory Visit (HOSPITAL_COMMUNITY)
Admission: RE | Admit: 2023-01-13 | Discharge: 2023-01-13 | Disposition: A | Discharge status: Home / Self Care | Payer: Medicare Other | Source: Ambulatory Visit | Attending: Vascular Surgery | Admitting: Vascular Surgery

## 2023-01-13 ENCOUNTER — Encounter: Payer: Self-pay | Admitting: Vascular Surgery

## 2023-01-13 ENCOUNTER — Ambulatory Visit (INDEPENDENT_AMBULATORY_CARE_PROVIDER_SITE_OTHER): Payer: Medicare Other | Admitting: Vascular Surgery

## 2023-01-13 VITALS — BP 124/77 | HR 81 | Temp 97.9°F | Resp 20 | Ht 64.0 in | Wt 118.0 lb

## 2023-01-13 DIAGNOSIS — M79674 Pain in right toe(s): Secondary | ICD-10-CM

## 2023-01-13 DIAGNOSIS — M79606 Pain in leg, unspecified: Secondary | ICD-10-CM | POA: Insufficient documentation

## 2023-01-13 LAB — VAS US ABI WITH/WO TBI
Left ABI: 1.1
Right ABI: 1.2

## 2023-01-21 ENCOUNTER — Encounter: Payer: Self-pay | Admitting: Internal Medicine

## 2023-01-22 ENCOUNTER — Encounter: Payer: Self-pay | Admitting: Internal Medicine

## 2023-01-22 ENCOUNTER — Ambulatory Visit (INDEPENDENT_AMBULATORY_CARE_PROVIDER_SITE_OTHER): Payer: Medicare Other | Admitting: Internal Medicine

## 2023-01-22 VITALS — BP 148/82 | HR 91 | Temp 97.3°F | Ht 64.0 in | Wt 119.8 lb

## 2023-01-22 DIAGNOSIS — Z8659 Personal history of other mental and behavioral disorders: Secondary | ICD-10-CM

## 2023-01-22 DIAGNOSIS — R350 Frequency of micturition: Secondary | ICD-10-CM

## 2023-01-22 DIAGNOSIS — K638219 Small intestinal bacterial overgrowth, unspecified: Secondary | ICD-10-CM | POA: Diagnosis not present

## 2023-01-22 LAB — POCT URINALYSIS DIPSTICK
Bilirubin, UA: NEGATIVE
Blood, UA: NEGATIVE
Glucose, UA: NEGATIVE
Ketones, UA: NEGATIVE
Leukocytes, UA: NEGATIVE
Nitrite, UA: NEGATIVE
Protein, UA: NEGATIVE
Spec Grav, UA: <=1.005 — AB (ref 1.010–1.025)
Urobilinogen, UA: 0.2 E.U./dL
pH, UA: 6 (ref 5.0–8.0)

## 2023-01-22 NOTE — Progress Notes (Addendum)
Patient Care Team: Margaree Mackintosh, MD as PCP - General  Visit Date: 01/22/23  Subjective:    Patient ID: [name] [name] , Female   DOB: [birth year], 77 y.o.    MRN: [id number]   77 y.o. Female presents today for diarrhea. Seen by Dr. Carlynn Herald, Gastroenterologist, and diagnosed with SIBO. Developed diarrhea after starting Flagyl. Has stopped Flagyl. Symptoms have improved. Says she does better with Rifaximin but it is very expensive on her insurance plan and Dr. Chilton Si gave her Flagyl recently instead for a flare of SIBO. She is going to the beach this weekend for a few days and is worried.Does not want to have diarrhea while she is on vacation at the beach.  Past Medical History:  Diagnosis Date   Anxiety state, unspecified    Bilateral shoulder pain    Bipolar disorder, unspecified (HCC)    Depressive disorder, not elsewhere classified    Diarrhea    Esophageal reflux    Hyperlipidemia    Hypertension    Irritable bowel syndrome    Personal history of colonic polyps 2005   hyperplastic   Thyroid disease    Unspecified hemorrhoids without mention of complication      Family History  Problem Relation Age of Onset   Kidney disease Brother    Heart disease Father    Arthritis Mother    Colon cancer Neg Hx     Social History   Social History Narrative   Social history: Does not smoke or consume alcohol.  Resides alone.  Is divorced.  Used to work as a Runner, broadcasting/film/video.  Used to sing in the choir at [employer] but since [place] came about she has not been doing that.  Completed 4 years of college.       Family history: Mother with history of "nervous breakdown".  Father with history of hypertension.  Brother with hypertension.  1 brother apparently died with end-stage kidney disease and was on lithium with history of alcohol abuse.          Review of Systems  Constitutional:  Negative for fever and malaise/fatigue.  HENT:  Negative for congestion.   Eyes:   Negative for blurred vision.  Respiratory:  Negative for cough and shortness of breath.   Cardiovascular:  Negative for chest pain, palpitations and leg swelling.  Gastrointestinal:  Positive for diarrhea. Negative for vomiting.  Musculoskeletal:  Negative for back pain.  Skin:  Negative for rash.  Neurological:  Negative for loss of consciousness and headaches.        Objective:   Vitals: BP (!) 148/82   Pulse 91   Temp (!) 97.3 F (36.3 C) (Tympanic)   Ht 5\' 4"  (1.626 m)   Wt 119 lb 12 oz (54.3 kg)   SpO2 99%   BMI 20.56 kg/m    Physical Exam Vitals and nursing note reviewed.  Constitutional:      General: She is not in acute distress.    Appearance: Normal appearance. She is not toxic-appearing.  HENT:     Head: Normocephalic and atraumatic.  Pulmonary:     Effort: Pulmonary effort is normal.  Skin:    General: Skin is warm and dry.  Neurological:     Mental Status: She is alert and oriented to person, place, and time. Mental status is at baseline.  Psychiatric:        Mood and Affect: Mood normal.        Behavior: Behavior  normal.        Thought Content: Thought content normal.        Judgment: Judgment normal.     Abdomen is soft: Nondistended without hepatosplenomegaly masses or tenderness.  No CVA tenderness.  Results:   Studies obtained and personally reviewed by me:   Labs:       Component Value Date/Time   NA 143 08/28/2022 0926   K 4.6 08/28/2022 0926   CL 104 08/28/2022 0926   CO2 25 08/28/2022 0926   GLUCOSE 92 08/28/2022 0926   BUN 19 08/28/2022 0926   CREATININE 1.26 (H) 08/28/2022 0926   CALCIUM 10.4 08/28/2022 0926   PROT 7.4 08/28/2022 0926   ALBUMIN 4.2 06/17/2016 1143   AST 22 08/28/2022 0926   ALT 16 08/28/2022 0926   ALKPHOS 58 06/17/2016 1143   BILITOT 0.6 08/28/2022 0926   GFRNONAA 44 (L) 08/16/2020 1006   GFRAA 51 (L) 08/16/2020 1006     Lab Results  Component Value Date   WBC 5.0 08/28/2022   HGB 14.6 08/28/2022    HCT 43.3 08/28/2022   MCV 94.3 08/28/2022   PLT 210 08/28/2022    Lab Results  Component Value Date   CHOL 118 08/28/2022   HDL 59 08/28/2022   LDLCALC 38 08/28/2022   TRIG 131 08/28/2022   CHOLHDL 2.0 08/28/2022    No results found for: "HGBA1C"   Lab Results  Component Value Date   TSH 2.03 08/28/2022     Assessment & Plan:   Diarrhea: history of SIBO. Symptoms improved. She has stopped Flagyl- will not restart at this time. Return as needed.  Abdominal exam is unremarkable.  Urinary frequency-no evidence of UTI today  Patient complaining of urinary frequency by urine specimen is normal. Does not want to have UTI at the beach. She was reassured that she does not test positive for UTI.   I,Alexander Ruley,acting as a Neurosurgeon for Margaree Mackintosh, MD.,have documented all relevant documentation on the behalf of Margaree Mackintosh, MD,as directed by  Margaree Mackintosh, MD while in the presence of Margaree Mackintosh, MD.   I, Margaree Mackintosh, MD, have reviewed all documentation for this visit. The documentation on 01/24/23 for the exam, diagnosis, procedures, and orders are all accurate and complete.

## 2023-01-24 DIAGNOSIS — K638219 Small intestinal bacterial overgrowth, unspecified: Secondary | ICD-10-CM | POA: Insufficient documentation

## 2023-01-24 NOTE — Patient Instructions (Signed)
We did not detect a urinary tract infection today.  We are glad that your diarrhea symptoms have improved.  Suggest not restarting Flagyl since you are going to the beach and symptoms are better.

## 2023-02-04 ENCOUNTER — Other Ambulatory Visit: Payer: Self-pay | Admitting: Internal Medicine

## 2023-02-04 MED ORDER — LEVOTHYROXINE SODIUM 25 MCG PO TABS: 25.0000 ug | ORAL_TABLET | Freq: Every day | ORAL | 0 refills | Status: DC

## 2023-02-11 DIAGNOSIS — Z85828 Personal history of other malignant neoplasm of skin: Secondary | ICD-10-CM | POA: Diagnosis not present

## 2023-02-11 DIAGNOSIS — M7130 Other bursal cyst, unspecified site: Secondary | ICD-10-CM | POA: Diagnosis not present

## 2023-02-11 DIAGNOSIS — L308 Other specified dermatitis: Secondary | ICD-10-CM | POA: Diagnosis not present
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
## 2023-03-10 ENCOUNTER — Other Ambulatory Visit: Payer: Medicare Other

## 2023-03-10 DIAGNOSIS — E781 Pure hyperglyceridemia: Secondary | ICD-10-CM

## 2023-03-10 DIAGNOSIS — Z0000 Encounter for general adult medical examination without abnormal findings: Secondary | ICD-10-CM

## 2023-03-10 DIAGNOSIS — E039 Hypothyroidism, unspecified: Secondary | ICD-10-CM | POA: Diagnosis not present

## 2023-03-10 NOTE — Progress Notes (Signed)
Lab only 

## 2023-03-12 ENCOUNTER — Ambulatory Visit (INDEPENDENT_AMBULATORY_CARE_PROVIDER_SITE_OTHER): Payer: Medicare Other | Admitting: Internal Medicine

## 2023-03-12 ENCOUNTER — Encounter: Payer: Self-pay | Admitting: Internal Medicine

## 2023-03-12 VITALS — BP 134/82 | HR 102 | Temp 97.7°F | Wt 123.0 lb

## 2023-03-12 DIAGNOSIS — F32A Depression, unspecified: Secondary | ICD-10-CM | POA: Diagnosis not present

## 2023-03-12 DIAGNOSIS — E039 Hypothyroidism, unspecified: Secondary | ICD-10-CM | POA: Diagnosis not present

## 2023-03-12 DIAGNOSIS — F419 Anxiety disorder, unspecified: Secondary | ICD-10-CM | POA: Diagnosis not present

## 2023-03-12 DIAGNOSIS — E781 Pure hyperglyceridemia: Secondary | ICD-10-CM

## 2023-03-17 ENCOUNTER — Encounter: Payer: Self-pay | Admitting: Internal Medicine

## 2023-03-17 NOTE — Telephone Encounter (Signed)
Added

## 2023-03-28 NOTE — Patient Instructions (Addendum)
It was a pleasure to see you today.  Return in 6 months for Medicare wellness and health maintenance exam.  Medications reviewed and detailed.  Labs are stable.  We discussed today that you are not taking reflux medication.  May take extra strength Tylenol sparingly for pain if needed.  Continue follow-up with Dr. Donell Beers.  Your blood pressure is stable off antihypertensive medication.

## 2023-03-31 ENCOUNTER — Telehealth: Payer: Self-pay | Admitting: Internal Medicine

## 2023-03-31 ENCOUNTER — Encounter: Payer: Self-pay | Admitting: Internal Medicine

## 2023-03-31 ENCOUNTER — Telehealth: Payer: Medicare Other | Admitting: Internal Medicine

## 2023-03-31 VITALS — BP 111/70 | Temp 99.0°F

## 2023-03-31 DIAGNOSIS — N1831 Chronic kidney disease, stage 3a: Secondary | ICD-10-CM | POA: Diagnosis not present

## 2023-03-31 DIAGNOSIS — U071 COVID-19: Secondary | ICD-10-CM

## 2023-03-31 NOTE — Patient Instructions (Signed)
We discussed taking Paxlovid or  a Z-pak. She has few symptoms and is OK with observation for now. Walk some to prevent atelectasis. Stay well hydrated. May take Tylenol sparingly if needed for fever. Quarantine for 5 days.

## 2023-03-31 NOTE — Telephone Encounter (Signed)
[name] [name] [phone]  [name] called to say she has tested positive for COVID this morning , she has Dry cough, stuffy, sneezing nose. I set her up for Video visit at 12:00 today

## 2023-03-31 NOTE — Progress Notes (Addendum)
Patient Care Team: Margaree Mackintosh, MD as PCP - General  I connected with Hassell Done on 03/31/23 at 12:43 PM by video enabled telemedicine visit and verified that I am speaking with the correct person using two identifiers.   I discussed the limitations, risks, security and privacy concerns of performing an evaluation and management service by telemedicine and the availability of in-person appointments. I also discussed with the patient that there may be a patient responsible charge related to this service. The patient expressed understanding and agreed to proceed.   Other persons participating in the visit and their role in the encounter: Medical scribe, Doylene Bode  Patient's location: Home  Provider's location: Clinic   I provided 15 minutes of time with this encounter including chart review, interviewing patient and medical decision making.   She is identified using two identifiers, [name] [name], a patient of this practice. She is in her home and I am in my practice. She is agreeable to using this format today.  Chief Complaint: sneezing   Subjective:    Patient ID: [name] [name] , Female    DOB: [birth year], 77 y.o.    MRN: [id number]   77 y.o. Female presents today for sneezing, itching in nose since 03/30/23. Reports positive Covid-19 test this morning. Denies nausea, diarrhea, headache.  Past Medical History:  Diagnosis Date   Anxiety state, unspecified    Bilateral shoulder pain    Bipolar disorder, unspecified (HCC)    Depressive disorder, not elsewhere classified    Diarrhea    Esophageal reflux    Hyperlipidemia    Hypertension    Irritable bowel syndrome    Personal history of colonic polyps 2005   hyperplastic   Thyroid disease    Unspecified hemorrhoids without mention of complication      Family History  Problem Relation Age of Onset   Kidney disease Brother    Heart disease Father    Arthritis Mother    Colon cancer Neg Hx     Social History    Social History Narrative   Social history: Does not smoke or consume alcohol.  Resides alone.  Is divorced.  Used to work as a Runner, broadcasting/film/video.  Used to sing in the choir at [employer] [facility] [employer] but since [place] came about she has not been doing that.  Completed 4 years of college.       Family history: Mother with history of "nervous breakdown".  Father with history of hypertension.  Brother with hypertension.  1 brother apparently died with end-stage kidney disease and was on lithium with history of alcohol abuse.          Review of Systems  Constitutional:  Negative for fever and malaise/fatigue.  HENT:  Negative for congestion.        (+) Sneezing  Eyes:  Negative for blurred vision.  Respiratory:  Negative for cough and shortness of breath.   Cardiovascular:  Negative for chest pain, palpitations and leg swelling.  Gastrointestinal:  Negative for diarrhea, nausea and vomiting.  Musculoskeletal:  Negative for back pain.  Skin:  Negative for rash.  Neurological:  Negative for loss of consciousness and headaches.        Objective:   Vitals: BP 111/70   Temp 99 F (37.2 C)    Physical Exam Vitals and nursing note reviewed.  Constitutional:      General: She is not in acute distress.    Appearance: Normal appearance. She is not toxic-appearing.  HENT:     Head: Normocephalic and atraumatic.  Pulmonary:     Effort: Pulmonary effort is normal.  Skin:    General: Skin is warm and dry.  Neurological:     Mental Status: She is alert and oriented to person, place, and time. Mental status is at baseline.  Psychiatric:        Mood and Affect: Mood normal.        Behavior: Behavior normal.        Thought Content: Thought content normal.        Judgment: Judgment normal.       Results:   Studies obtained and personally reviewed by me:   Labs:       Component Value Date/Time   NA 140 03/10/2023 0935   K 4.9 03/10/2023 0935   CL 104 03/10/2023 0935   CO2 27  03/10/2023 0935   GLUCOSE 94 03/10/2023 0935   BUN 19 03/10/2023 0935   CREATININE 1.26 (H) 03/10/2023 0935   CALCIUM 9.8 03/10/2023 0935   PROT 7.4 08/28/2022 0926   ALBUMIN 4.2 06/17/2016 1143   AST 22 08/28/2022 0926   ALT 16 08/28/2022 0926   ALKPHOS 58 06/17/2016 1143   BILITOT 0.6 08/28/2022 0926   GFRNONAA 44 (L) 08/16/2020 1006   GFRAA 51 (L) 08/16/2020 1006     Lab Results  Component Value Date   WBC 5.0 08/28/2022   HGB 14.6 08/28/2022   HCT 43.3 08/28/2022   MCV 94.3 08/28/2022   PLT 210 08/28/2022    Lab Results  Component Value Date   CHOL 128 03/10/2023   HDL 58 03/10/2023   LDLCALC 48 03/10/2023   TRIG 141 03/10/2023   CHOLHDL 2.2 03/10/2023    No results found for: "HGBA1C"   Lab Results  Component Value Date   TSH 2.47 03/10/2023      Assessment & Plan:   Acute Covid-19 Infection: we discussed taking Paxlovid and she believes she will be alright without it. Get plenty of rest, hydrate well, and walk around. Advised to quarantine for 5 days. Contact us if new or worsening symptoms develop or if symptoms fail to improve.  Chronic kidney disease stage 3a- stable  I,Alexander Ruley,acting as a scribe for Margaree Mackintosh, MD.,have documented all relevant documentation on the behalf of Margaree Mackintosh, MD,as directed by  Margaree Mackintosh, MD while in the presence of Margaree Mackintosh, MD.   I, Margaree Mackintosh, MD, have reviewed all documentation for this visit. The documentation on 03/31/23 for the exam, diagnosis, procedures, and orders are all accurate and complete.

## 2023-04-14 DIAGNOSIS — L570 Actinic keratosis: Secondary | ICD-10-CM | POA: Diagnosis not present

## 2023-04-14 DIAGNOSIS — L821 Other seborrheic keratosis: Secondary | ICD-10-CM | POA: Diagnosis not present

## 2023-04-14 DIAGNOSIS — L738 Other specified follicular disorders: Secondary | ICD-10-CM | POA: Diagnosis not present

## 2023-04-14 DIAGNOSIS — D1801 Hemangioma of skin and subcutaneous tissue: Secondary | ICD-10-CM | POA: Diagnosis not present

## 2023-04-14 DIAGNOSIS — M7130 Other bursal cyst, unspecified site: Secondary | ICD-10-CM | POA: Diagnosis not present

## 2023-04-14 DIAGNOSIS — D2272 Melanocytic nevi of left lower limb, including hip: Secondary | ICD-10-CM | POA: Diagnosis not present

## 2023-04-14 DIAGNOSIS — Z85828 Personal history of other malignant neoplasm of skin: Secondary | ICD-10-CM | POA: Diagnosis not present

## 2023-04-27 DIAGNOSIS — M19032 Primary osteoarthritis, left wrist: Secondary | ICD-10-CM | POA: Diagnosis not present

## 2023-04-27 DIAGNOSIS — M25532 Pain in left wrist: Secondary | ICD-10-CM | POA: Diagnosis not present

## 2023-05-03 ENCOUNTER — Other Ambulatory Visit: Payer: Self-pay | Admitting: Internal Medicine

## 2023-05-07 DIAGNOSIS — K638219 Small intestinal bacterial overgrowth, unspecified: Secondary | ICD-10-CM | POA: Diagnosis not present

## 2023-05-15 DIAGNOSIS — K5909 Other constipation: Secondary | ICD-10-CM | POA: Diagnosis not present

## 2023-05-15 DIAGNOSIS — K638219 Small intestinal bacterial overgrowth, unspecified: Secondary | ICD-10-CM | POA: Diagnosis not present

## 2023-08-16 ENCOUNTER — Encounter: Payer: Self-pay | Admitting: Internal Medicine

## 2023-08-16 NOTE — Telephone Encounter (Signed)
 [place], Anytime you get the Flu shot at our office you get the regular shot, that is what Dr Lenord Fellers gets, a lot of time people 72 with a lot of health conditions gets the other shot. It is total up to you which one you get. Zannie Cove

## 2023-08-18 ENCOUNTER — Encounter: Payer: Self-pay | Admitting: Internal Medicine

## 2023-08-18 DIAGNOSIS — Z23 Encounter for immunization: Secondary | ICD-10-CM | POA: Diagnosis not present

## 2023-09-01 DIAGNOSIS — M19042 Primary osteoarthritis, left hand: Secondary | ICD-10-CM | POA: Diagnosis not present

## 2023-09-01 DIAGNOSIS — M19041 Primary osteoarthritis, right hand: Secondary | ICD-10-CM | POA: Diagnosis not present

## 2023-09-04 ENCOUNTER — Other Ambulatory Visit: Payer: Medicare Other

## 2023-09-04 DIAGNOSIS — Z0000 Encounter for general adult medical examination without abnormal findings: Secondary | ICD-10-CM

## 2023-09-04 DIAGNOSIS — E039 Hypothyroidism, unspecified: Secondary | ICD-10-CM

## 2023-09-04 DIAGNOSIS — N1831 Chronic kidney disease, stage 3a: Secondary | ICD-10-CM

## 2023-09-04 DIAGNOSIS — F419 Anxiety disorder, unspecified: Secondary | ICD-10-CM | POA: Diagnosis not present

## 2023-09-04 DIAGNOSIS — E781 Pure hyperglyceridemia: Secondary | ICD-10-CM | POA: Diagnosis not present

## 2023-09-04 DIAGNOSIS — M8580 Other specified disorders of bone density and structure, unspecified site: Secondary | ICD-10-CM

## 2023-09-04 DIAGNOSIS — F32A Depression, unspecified: Secondary | ICD-10-CM | POA: Diagnosis not present

## 2023-09-05 LAB — COMPLETE METABOLIC PANEL WITH GFR
AG Ratio: 1.6 (calc) (ref 1.0–2.5)
ALT: 16 U/L (ref 6–29)
AST: 22 U/L (ref 10–35)
Albumin: 4.3 g/dL (ref 3.6–5.1)
Alkaline phosphatase (APISO): 68 U/L (ref 37–153)
BUN/Creatinine Ratio: 14 (calc) (ref 6–22)
BUN: 19 mg/dL (ref 7–25)
CO2: 27 mmol/L (ref 20–32)
Calcium: 9.5 mg/dL (ref 8.6–10.4)
Chloride: 103 mmol/L (ref 98–110)
Creat: 1.35 mg/dL — ABNORMAL HIGH (ref 0.60–1.00)
Globulin: 2.7 g/dL (ref 1.9–3.7)
Glucose, Bld: 96 mg/dL (ref 65–99)
Potassium: 4.6 mmol/L (ref 3.5–5.3)
Sodium: 141 mmol/L (ref 135–146)
Total Bilirubin: 0.6 mg/dL (ref 0.2–1.2)
Total Protein: 7 g/dL (ref 6.1–8.1)
eGFR: 40 mL/min/{1.73_m2} — ABNORMAL LOW (ref >=60)

## 2023-09-05 LAB — CBC WITH DIFFERENTIAL/PLATELET
Absolute Lymphocytes: 1695 {cells}/uL (ref 850–3900)
Absolute Monocytes: 663 {cells}/uL (ref 200–950)
Basophils Absolute: 40 {cells}/uL (ref 0–200)
Basophils Relative: 0.6 %
Eosinophils Absolute: 141 {cells}/uL (ref 15–500)
Eosinophils Relative: 2.1 %
HCT: 42.8 % (ref 35.0–45.0)
Hemoglobin: 14 g/dL (ref 11.7–15.5)
MCH: 30.9 pg (ref 27.0–33.0)
MCHC: 32.7 g/dL (ref 32.0–36.0)
MCV: 94.5 fL (ref 80.0–100.0)
MPV: 9.5 fL (ref 7.5–12.5)
Monocytes Relative: 9.9 %
Neutro Abs: 4161 {cells}/uL (ref 1500–7800)
Neutrophils Relative %: 62.1 %
Platelets: 195 10*3/uL (ref 140–400)
RBC: 4.53 10*6/uL (ref 3.80–5.10)
RDW: 11.2 % (ref 11.0–15.0)
Total Lymphocyte: 25.3 %
WBC: 6.7 10*3/uL (ref 3.8–10.8)

## 2023-09-05 LAB — LIPID PANEL
Cholesterol: 128 mg/dL (ref <200)
HDL: 63 mg/dL (ref >=50)
LDL Cholesterol (Calc): 45 mg/dL
Non-HDL Cholesterol (Calc): 65 mg/dL (ref <130)
Total CHOL/HDL Ratio: 2 (calc) (ref <5.0)
Triglycerides: 123 mg/dL (ref <150)

## 2023-09-05 LAB — TSH: TSH: 2.67 m[IU]/L (ref 0.40–4.50)

## 2023-09-08 ENCOUNTER — Other Ambulatory Visit: Payer: Medicare Other

## 2023-09-08 DIAGNOSIS — R7989 Other specified abnormal findings of blood chemistry: Secondary | ICD-10-CM

## 2023-09-08 DIAGNOSIS — R799 Abnormal finding of blood chemistry, unspecified: Secondary | ICD-10-CM

## 2023-09-09 LAB — BUN/CREATININE RATIO
BUN/Creatinine Ratio: 15 (calc) (ref 6–22)
BUN: 18 mg/dL (ref 7–25)
Creat: 1.24 mg/dL — ABNORMAL HIGH (ref 0.60–1.00)
eGFR: 45 mL/min/{1.73_m2} — ABNORMAL LOW (ref >=60)

## 2023-09-10 ENCOUNTER — Ambulatory Visit: Payer: Medicare Other | Admitting: Internal Medicine

## 2023-09-10 ENCOUNTER — Encounter: Payer: Self-pay | Admitting: Internal Medicine

## 2023-09-10 VITALS — BP 130/80 | HR 100 | Ht 64.0 in | Wt 127.0 lb

## 2023-09-10 DIAGNOSIS — E039 Hypothyroidism, unspecified: Secondary | ICD-10-CM | POA: Diagnosis not present

## 2023-09-10 DIAGNOSIS — E781 Pure hyperglyceridemia: Secondary | ICD-10-CM | POA: Diagnosis not present

## 2023-09-10 DIAGNOSIS — F32A Depression, unspecified: Secondary | ICD-10-CM

## 2023-09-10 DIAGNOSIS — N1831 Chronic kidney disease, stage 3a: Secondary | ICD-10-CM | POA: Diagnosis not present

## 2023-09-10 DIAGNOSIS — K638219 Small intestinal bacterial overgrowth, unspecified: Secondary | ICD-10-CM | POA: Diagnosis not present

## 2023-09-10 DIAGNOSIS — J3089 Other allergic rhinitis: Secondary | ICD-10-CM | POA: Diagnosis not present

## 2023-09-10 DIAGNOSIS — F419 Anxiety disorder, unspecified: Secondary | ICD-10-CM

## 2023-09-10 DIAGNOSIS — Z0000 Encounter for general adult medical examination without abnormal findings: Secondary | ICD-10-CM | POA: Diagnosis not present

## 2023-09-10 NOTE — Progress Notes (Addendum)
Annual Wellness Visit   Patient Care Team: Margaree Mackintosh, MD as PCP - General  Visit Date: 09/10/23   Chief Complaint  Patient presents with   Medicare Wellness   Elevated serum creatinine   Subjective:  Patient: [name] [name], Female DOB: Jan 24, 1946, 78 y.o. MRN: [id number]  [name] [name] is a 78 y.o. Female who presents today for her Annual Medicare Wellness visit. Patient has history of Hypothyroidism, Depression, HLD, Osteopenia, GE Reflux, Hx of Constipation & Abdominal Bloating, Hx of Elevated Serum Creatinine, Allergic Rhinitis w/Reactions to Dust/Mold/Dust Mites.   Hypothyroidism treated with Levothyroxine 25 mcg daily. 09/04/23 TSH: 2.67.   History of Depression treated with Doxepin 25 mg nightly and Pamelor 75 mg at bedtime. Also takes Neurontin 800 mg at bedtime. Followed by Dr Laverna Peace. Notes that her mother was a chronic worrier, which she seems to have picked up on. Is anxious today because her elevated creatinine;  has seen Dr. Kathrene Bongo once in the past, who also suggested her elevation may be due to decreased kidney function attributed to varying reasons. Knows to avoid NSAIDs. Notes that she doesn't drink water regularly throughout the day, only when taking her medications. Has 1 cup of coffee in the mornings and 1 large [employer] tea for lunch and some for dinner. Hasn't followed-up with [facility] as she wasn't instructed to by them to. Also notes her urine volume and frequency has increased since she has started drinking more water throughout the day.   Hyperlipidemia treated with Lipitor 40 mg daily.    History of Chronic Constipation treated with 1/2 cap of Miralax in the morning and afternoon. 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  Complete Rotator Cuff Tear or Rupture of Right Shoulder treated with Tylenol.   Labs 09/04/23 & 09/08/23 CBC: WNL CMP, compared to 08/28/22: Creatinine 1.35, increased from 1.26; eGRF 40, decreased  from 44; otherwise WNL.  Lipid Panel: WNL Serum Creatinine: collected BUN/Creatinine Ratio, compared to 09/04/23: Creatinine 1.24, decreased from 1.35 but still elevated; eGFR 45, increased from 40, but still low; otherwise WNL.   Mammogram 11/04/22 normal with repeat recommendation of 2025.   Bone Density 07/20/20 with T-score -2.0, osteopenic. Osteopenia treated with 300 mg OS-CAL daily.  Remote history of fractured wrist.  Colonoscopy 12/29/12 was with some mucosal changes of melanosis coli noted, otherwise normal. Discontinued.   Vaccine Counseling: Due for Covid-19; UTD on Shingles, Flu, PNA, Tdap Past Medical History:  Diagnosis Date   Anxiety state, unspecified    Bilateral shoulder pain    Bipolar disorder, unspecified (HCC)    Depressive disorder, not elsewhere classified    Diarrhea    Esophageal reflux    Hyperlipidemia    Hypertension    Irritable bowel syndrome    Personal history of colonic polyps 2005   hyperplastic   Thyroid disease    Unspecified hemorrhoids without mention of complication   Medical/Surgical History Narrative:  History of Allergic Rhinitis with reactions to dust, mold, and dust mites. Has been seen by Allergist.   GE Reflux not currently treated with prescription medication, but symptoms well controlled.  Family History  Problem Relation Age of Onset   Kidney disease Brother    Heart disease Father    Arthritis Mother    Colon cancer Neg Hx   Family History Narrative: Social History   Social History Narrative   Social history: Does not smoke or consume alcohol.  Resides alone.  Is divorced.  Used to work as a Runner, broadcasting/film/video.  Used to sing in the choir at [employer] [facility] [employer] but since [place] came about she has not been doing that.  Completed 4 years of college.       Family history: Mother with history of "nervous breakdown".  Father with history of hypertension.  Brother with hypertension.  1 brother apparently died with end-stage kidney disease  and was on lithium with history of alcohol abuse.      Singing in choir still Daughter got married and moved to [place]  Review of Systems  Constitutional:  Negative for chills, fever, malaise/fatigue and weight loss.  HENT:  Negative for hearing loss, sinus pain and sore throat.   Respiratory:  Negative for cough, hemoptysis and shortness of breath.   Cardiovascular:  Negative for chest pain, palpitations, leg swelling and PND.  Gastrointestinal:  Negative for abdominal pain, constipation, diarrhea, heartburn, nausea and vomiting.  Genitourinary:  Negative for dysuria, frequency and urgency.  Musculoskeletal:  Negative for back pain, myalgias and neck pain.  Skin:  Negative for itching and rash.  Neurological:  Negative for dizziness, tingling, seizures and headaches.  Endo/Heme/Allergies:  Negative for polydipsia.  Psychiatric/Behavioral:  Negative for depression. The patient is nervous/anxious.     Objective:  Vitals: BP 130/80   Pulse 100   Ht 5\' 4"  (1.626 m)   Wt 127 lb (57.6 kg)   SpO2 98%   BMI 21.80 kg/m  Physical Exam Vitals and nursing note reviewed.  Constitutional:      General: She is not in acute distress.    Appearance: Normal appearance. She is not toxic-appearing.  HENT:     Head: Normocephalic and atraumatic.  Cardiovascular:     Rate and Rhythm: Normal rate and regular rhythm. No extrasystoles are present.    Pulses: Normal pulses.     Heart sounds: Normal heart sounds. No murmur heard.    No friction rub. No gallop.  Pulmonary:     Effort: Pulmonary effort is normal. No respiratory distress.     Breath sounds: Normal breath sounds. No wheezing or rales.  Skin:    General: Skin is warm and dry.  Neurological:     Mental Status: She is alert and oriented to person, place, and time. Mental status is at baseline.  Psychiatric:        Mood and Affect: Mood normal.        Behavior: Behavior normal.        Thought Content: Thought content normal.         Judgment: Judgment normal.    Most Recent Functional Status Assessment:    09/10/2023    2:55 PM  In your present state of health, do you have any difficulty performing the following activities:  Hearing? 0  Vision? 0  Difficulty concentrating or making decisions? 0  Walking or climbing stairs? 0  Dressing or bathing? 0  Doing errands, shopping? 0  Preparing Food and eating ? N  Using the Toilet? N  In the past six months, have you accidently leaked urine? N  Do you have problems with loss of bowel control? N  Managing your Medications? N  Managing your Finances? N  Housekeeping or managing your Housekeeping? N   Most Recent Fall Risk Assessment:    09/10/2023    2:56 PM  Fall Risk   Falls in the past year? 1  Number falls in past yr: 0  Injury with Fall? 0  Risk for fall due  to : Other (Comment)  Follow up Falls evaluation completed;Education provided;Falls prevention discussed   Most Recent Depression Screenings:    09/04/2022    3:36 PM 08/19/2021    3:10 PM  PHQ 2/9 Scores  PHQ - 2 Score 0 0   Most Recent Cognitive Screening:    09/10/2023    3:00 PM  6CIT Screen  What Year? 0 points  What month? 0 points  What time? 0 points  Count back from 20 0 points  Months in reverse 0 points  Repeat phrase 0 points  Total Score 0 points   Results:  Studies obtained and personally reviewed by me:  Mammogram 11/04/22 normal with repeat recommendation of 2025.   Bone Density 07/20/20 with T-score -2.0, osteopenic  Colonoscopy 12/29/12 was with some mucosal changes of melanosis coli noted, otherwise normal. Discontinued.   Labs:     Component Value Date/Time   NA 141 09/04/2023 0912   K 4.6 09/04/2023 0912   CL 103 09/04/2023 0912   CO2 27 09/04/2023 0912   GLUCOSE 96 09/04/2023 0912   BUN 18 09/08/2023 1111   CREATININE 1.24 (H) 09/08/2023 1111   CALCIUM 9.5 09/04/2023 0912   PROT 7.0 09/04/2023 0912   ALBUMIN 4.2 06/17/2016 1143   AST 22 09/04/2023 0912    ALT 16 09/04/2023 0912   ALKPHOS 58 06/17/2016 1143   BILITOT 0.6 09/04/2023 0912   GFRNONAA 44 (L) 08/16/2020 1006   GFRAA 51 (L) 08/16/2020 1006    Lab Results  Component Value Date   WBC 6.7 09/04/2023   HGB 14.0 09/04/2023   HCT 42.8 09/04/2023   MCV 94.5 09/04/2023   PLT 195 09/04/2023   Lab Results  Component Value Date   CHOL 128 09/04/2023   HDL 63 09/04/2023   LDLCALC 45 09/04/2023   TRIG 123 09/04/2023   CHOLHDL 2.0 09/04/2023   Lab Results  Component Value Date   TSH 2.67 09/04/2023    Assessment & Plan:   Orders Placed This Encounter  Procedures   POCT URINALYSIS DIP (CLINITEK)   Other Labs Reviewed today: CBC: WNL CMP, compared to 08/28/22: Creatinine 1.35, increased from 1.26; eGRF 40, decreased from 44; otherwise WNL.   Hypothyroidism treated with Levothyroxine 25 mcg daily. 09/04/23 TSH: 2.67.   Depression treated with Doxepin 25 mg nightly and Pamelor 75 mg at bedtime. Also takes Neurontin 800 mg at bedtime. Followed by Dr Laverna Peace. Is anxious today because her elevated creatinine.   Elevated Creatinine; 09/04/23 Creatinine 1.35, increased from 1.26; eGRF 40, decreased from 44; otherwise WNL. Serum Creatinine: collected 09/08/23 BUN/Creatinine Ratio, compared to 09/04/23: Creatinine 1.24, decreased from 1.35 but still elevated; eGFR 45, increased from 40, but still low; otherwise WNL. Avoids NSAIDs. Notes that she doesn't drink water regularly throughout the day, only when taking her medications. Discussed extensively that although levels are elevated above normal limits, she is not within dangerous limits and should focus on drinking enough water (or any liquids) to stay consistently hydrated.   Hyperlipidemia treated with Lipitor 40 mg daily. Lipid Panel: WNL   Chronic Constipation treated with 1/2 cap of Miralax in the morning and afternoon. 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  Complete Rotator Cuff Tear or Rupture of Right  Shoulder treated with Tylenol.   Mammogram 11/04/22 normal with repeat recommendation of 2025.   Bone Density 07/20/20 with T-score -2.0, osteopenic. Osteopenia treated with 300 mg Os-CAL daily.     Colonoscopy 12/29/12 was  with some mucosal changes of melanosis coli noted, otherwise normal. Discontinued.   Vaccine Counseling: Due for Covid-19; UTD on Shingles, Flu, PNA, Tdap   Annual wellness visit done today including the all of the following: Reviewed patient's Family Medical History Reviewed and updated list of patient's medical providers Assessment of cognitive impairment was done Assessed patient's functional ability Established a written schedule for health screening services Health Risk Assessent Completed and Reviewed  Discussed health benefits of physical activity, and encouraged her to engage in regular exercise appropriate for her age and condition.    I,Emily Lagle,acting as a Neurosurgeon for Margaree Mackintosh, MD.,have documented all relevant documentation on the behalf of Margaree Mackintosh, MD,as directed by  Margaree Mackintosh, MD while in the presence of Margaree Mackintosh, MD.   I, Margaree Mackintosh, MD, have reviewed all documentation for this visit. The documentation on 09/11/23 for the exam, diagnosis, procedures, and orders are all accurate and complete.

## 2023-09-11 ENCOUNTER — Encounter: Payer: Self-pay | Admitting: Internal Medicine

## 2023-09-11 NOTE — Patient Instructions (Addendum)
Continue current meds. Labs are stable. RTC in 6 months.

## 2023-10-23 ENCOUNTER — Other Ambulatory Visit: Payer: Self-pay | Admitting: Internal Medicine

## 2023-10-29 DIAGNOSIS — F331 Major depressive disorder, recurrent, moderate: Secondary | ICD-10-CM | POA: Diagnosis not present

## 2023-11-04 DIAGNOSIS — Z1231 Encounter for screening mammogram for malignant neoplasm of breast: Secondary | ICD-10-CM | POA: Diagnosis not present

## 2023-11-04 LAB — HM MAMMOGRAPHY

## 2023-11-05 ENCOUNTER — Encounter: Payer: Self-pay | Admitting: Internal Medicine

## 2023-11-09 DIAGNOSIS — Z85828 Personal history of other malignant neoplasm of skin: Secondary | ICD-10-CM | POA: Diagnosis not present

## 2023-11-09 DIAGNOSIS — D235 Other benign neoplasm of skin of trunk: Secondary | ICD-10-CM | POA: Diagnosis not present

## 2023-11-09 DIAGNOSIS — D1801 Hemangioma of skin and subcutaneous tissue: Secondary | ICD-10-CM | POA: Diagnosis not present

## 2023-11-09 DIAGNOSIS — D485 Neoplasm of uncertain behavior of skin: Secondary | ICD-10-CM | POA: Diagnosis not present

## 2023-11-09 DIAGNOSIS — C44319 Basal cell carcinoma of skin of other parts of face: Secondary | ICD-10-CM | POA: Diagnosis not present

## 2023-11-09 DIAGNOSIS — L570 Actinic keratosis: Secondary | ICD-10-CM | POA: Diagnosis not present

## 2023-11-09 DIAGNOSIS — H61002 Unspecified perichondritis of left external ear: Secondary | ICD-10-CM | POA: Diagnosis not present

## 2023-11-10 DIAGNOSIS — F331 Major depressive disorder, recurrent, moderate: Secondary | ICD-10-CM | POA: Diagnosis not present

## 2023-12-03 DIAGNOSIS — F331 Major depressive disorder, recurrent, moderate: Secondary | ICD-10-CM | POA: Diagnosis not present

## 2023-12-15 DIAGNOSIS — Z85828 Personal history of other malignant neoplasm of skin: Secondary | ICD-10-CM | POA: Diagnosis not present

## 2023-12-15 DIAGNOSIS — C44319 Basal cell carcinoma of skin of other parts of face: Secondary | ICD-10-CM | POA: Diagnosis not present

## 2023-12-23 DIAGNOSIS — M79675 Pain in left toe(s): Secondary | ICD-10-CM | POA: Diagnosis not present

## 2024-01-06 DIAGNOSIS — F331 Major depressive disorder, recurrent, moderate: Secondary | ICD-10-CM | POA: Diagnosis not present

## 2024-01-27 DIAGNOSIS — F331 Major depressive disorder, recurrent, moderate: Secondary | ICD-10-CM | POA: Diagnosis not present

## 2024-02-11 ENCOUNTER — Other Ambulatory Visit: Payer: Self-pay | Admitting: Internal Medicine

## 2024-02-14 ENCOUNTER — Other Ambulatory Visit: Payer: Self-pay | Admitting: Internal Medicine

## 2024-03-03 DIAGNOSIS — F331 Major depressive disorder, recurrent, moderate: Secondary | ICD-10-CM | POA: Diagnosis not present

## 2024-03-04 ENCOUNTER — Encounter: Payer: Self-pay | Admitting: Internal Medicine

## 2024-03-04 ENCOUNTER — Other Ambulatory Visit: Payer: Medicare Other

## 2024-03-04 DIAGNOSIS — E781 Pure hyperglyceridemia: Secondary | ICD-10-CM

## 2024-03-04 DIAGNOSIS — E039 Hypothyroidism, unspecified: Secondary | ICD-10-CM

## 2024-03-04 LAB — HEPATIC FUNCTION PANEL
AG Ratio: 1.8 (calc) (ref 1.0–2.5)
ALT: 23 U/L (ref 6–29)
AST: 25 U/L (ref 10–35)
Albumin: 4.4 g/dL (ref 3.6–5.1)
Alkaline phosphatase (APISO): 63 U/L (ref 37–153)
Bilirubin, Direct: 0.1 mg/dL (ref 0.0–0.2)
Globulin: 2.4 g/dL (ref 1.9–3.7)
Indirect Bilirubin: 0.4 mg/dL (ref 0.2–1.2)
Total Bilirubin: 0.5 mg/dL (ref 0.2–1.2)
Total Protein: 6.8 g/dL (ref 6.1–8.1)

## 2024-03-04 LAB — LIPID PANEL
Cholesterol: 126 mg/dL (ref <200)
HDL: 61 mg/dL (ref >=50)
LDL Cholesterol (Calc): 42 mg/dL
Non-HDL Cholesterol (Calc): 65 mg/dL (ref <130)
Total CHOL/HDL Ratio: 2.1 (calc) (ref <5.0)
Triglycerides: 143 mg/dL (ref <150)

## 2024-03-04 LAB — TSH: TSH: 2.03 m[IU]/L (ref 0.40–4.50)

## 2024-03-10 ENCOUNTER — Encounter: Payer: Self-pay | Admitting: Internal Medicine

## 2024-03-10 ENCOUNTER — Ambulatory Visit (INDEPENDENT_AMBULATORY_CARE_PROVIDER_SITE_OTHER): Payer: Medicare Other | Admitting: Internal Medicine

## 2024-03-10 VITALS — BP 130/70 | HR 90 | Ht 64.0 in

## 2024-03-10 DIAGNOSIS — E781 Pure hyperglyceridemia: Secondary | ICD-10-CM

## 2024-03-10 DIAGNOSIS — N1831 Chronic kidney disease, stage 3a: Secondary | ICD-10-CM | POA: Diagnosis not present

## 2024-03-10 DIAGNOSIS — E039 Hypothyroidism, unspecified: Secondary | ICD-10-CM | POA: Diagnosis not present

## 2024-03-10 DIAGNOSIS — F32A Depression, unspecified: Secondary | ICD-10-CM | POA: Diagnosis not present

## 2024-03-10 DIAGNOSIS — J3089 Other allergic rhinitis: Secondary | ICD-10-CM | POA: Diagnosis not present

## 2024-03-10 DIAGNOSIS — F419 Anxiety disorder, unspecified: Secondary | ICD-10-CM | POA: Diagnosis not present

## 2024-03-10 DIAGNOSIS — K219 Gastro-esophageal reflux disease without esophagitis: Secondary | ICD-10-CM | POA: Diagnosis not present

## 2024-03-10 DIAGNOSIS — K638219 Small intestinal bacterial overgrowth, unspecified: Secondary | ICD-10-CM

## 2024-03-10 DIAGNOSIS — M8580 Other specified disorders of bone density and structure, unspecified site: Secondary | ICD-10-CM

## 2024-03-10 NOTE — Progress Notes (Addendum)
 Patient Care Team: Perri Ronal PARAS, MD as PCP - General  Visit Date: 03/10/24  Subjective:   Chief Complaint  Patient presents with  . Hypothyroidism  . Hyperlipidemia   Patient PI:[name] [name],Female DOB:06-10-1946,78 y.o. FMW:[id number]   78 y.o.Female presents today for 6 months follow-up for Hyperlipidemia;  Hx of Hypertension not  requiring medication currently and BP stable off medication; Chronic Kidney Disease, IIIa; Hypothyroidism; Depression; Chronic Constipation. Patient has a past medical history of Allergic Rhinitis with reactions to dust, mold, and dust mites; GE Reflux not currently treated with prescription medication. Seen for her annual visit on 09/10/2023, in the interim has seen Dentistry, Therapeutic Counseling, and OrthoSurg.   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  History of Hypertension not currently on pharmacological treatment, previously has been on Losartan . Blood Pressure: normotensive today at 130/70.   History of Hyperlipidemia treated with Atorvastatin  40 mg daily. 03/04/2024 Lipid Panel: WNL.   History of Hypothyroidism treated with Levothyroxine  25 mcg daily. 03/04/2024 TSH: 2.03.  History of Chronic Kidney Disease, stage IIIa w/ Creatinine 1.24 and eGFR 45 in January 2025. Saw Dr. Prescilla in 2021 with PRN follow-up.   Reviewed 03/03/2024 Hepatic Function Panel: WNL.   Mammogram 11/05/2023 normal.   History of Depression treated with Doxepin 25 mg nightly, Nortriptyline 75 mg at bedtime, and also takes Gabapentin 800 mg at bedtime. Followed by KEN Elveria Her, who she last saw in 01/2024.  History of SIBO followed by Dr. Belvie Seip, GI; Chronic Constipation treated with 5.5 g of Miralax twice daily.  Mild Rotator Cuff Tear/Rupture, Right Shoulder treated with OTC Tylenol as needed,  History of Osteopenia treated with  Calcium  Carbonate 300 mg daily w/ Vitamin-D 1000 units daily. Bone Density 07/20/20 with T-score -2.0, osteopenic.  Vaccine Counseling: Covid-19 and Influenza discussed - she does plan on updating both. Past Medical History:  Diagnosis Date  . Anxiety state, unspecified   . Bilateral shoulder pain   . Bipolar disorder, unspecified (HCC)   . Depressive disorder, not elsewhere classified   . Diarrhea   . Esophageal reflux   . Hyperlipidemia   . Hypertension   . Irritable bowel syndrome   . Personal history of colonic polyps 2005   hyperplastic  . Thyroid  disease   . 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 Sheryle No Known Allergies  Family History  Problem Relation Age of Onset  . Kidney disease Brother   . Heart disease Father   . Arthritis Mother   . Colon cancer Neg Hx    Social History   Social History Narrative   Social history: Does not smoke or consume alcohol.  Resides alone.  Is divorced.  Used to work as a Runner, broadcasting/film/video.  Used to sing in the choir at [employer] but since [place] came about she has not been doing that.  Completed 4 years of college.       Family history: Mother with history of nervous breakdown.  Father with history of hypertension.  Brother with hypertension.  1 brother apparently died with end-stage kidney disease and was on lithium with history of alcohol abuse.      Still singing in choir. Usually walks 1 mile 3x/ week, but not lately due to the humidity.   Review of Systems  Constitutional:  Negative for fever and malaise/fatigue.  HENT:  Negative for congestion.   Eyes:  Negative for blurred vision.  Respiratory:  Negative for cough and shortness of breath.   Cardiovascular:  Negative for chest pain, palpitations and leg swelling.  Gastrointestinal:  Negative for vomiting.  Musculoskeletal:  Negative for back pain.  Skin:   Negative for rash.  Neurological:  Negative for loss of consciousness and headaches.     Objective:  Vitals: BP 130/70   Pulse 90   Ht 5' 4 (1.626 m)   SpO2 97%   BMI 21.80 kg/m   Physical Exam Vitals and nursing note reviewed.  Constitutional:      General: She is not in acute distress.    Appearance: Normal appearance. She is not toxic-appearing.  HENT:     Head: Normocephalic and atraumatic.  Cardiovascular:     Rate and Rhythm: Normal rate and regular rhythm. No extrasystoles are present.    Pulses: Normal pulses.     Heart sounds: Normal heart sounds. No murmur heard.    No friction rub. No gallop.  Pulmonary:     Effort: Pulmonary effort is normal. No respiratory distress.     Breath sounds: Normal breath sounds. No wheezing or rales.  Musculoskeletal:     Left shoulder: Normal.     Comments: In right shoulder ROM is barely affected with mild tenderness with movement according to patient  Skin:    General: Skin is warm and dry.  Neurological:     Mental Status: She is alert and oriented to person, place, and time. Mental status is at baseline.  Psychiatric:        Mood and Affect: Mood normal.        Behavior: Behavior normal.        Thought Content: Thought content normal.        Judgment: Judgment normal.     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  Mammogram 11/05/2023 normal.   Bone Density 07/20/20 with T-score -2.0, osteopenic.  Labs:     Component Value Date/Time   NA 141 09/04/2023 0912   K 4.6 09/04/2023 0912   CL 103 09/04/2023 0912   CO2 27 09/04/2023 0912   GLUCOSE 96 09/04/2023 0912   BUN 18 09/08/2023 1111   CREATININE 1.24 (H) 09/08/2023 1111   CALCIUM  9.5 09/04/2023 0912   PROT 6.8 03/04/2024 0928   ALBUMIN 4.2 06/17/2016 1143   AST 25 03/04/2024 0928    ALT 23 03/04/2024 0928   ALKPHOS 58 06/17/2016 1143   BILITOT 0.5 03/04/2024 0928   GFRNONAA 44 (L) 08/16/2020 1006   GFRAA 51 (L) 08/16/2020 1006    Lab Results  Component Value Date   WBC 6.7 09/04/2023   HGB 14.0 09/04/2023   HCT 42.8 09/04/2023   MCV 94.5 09/04/2023   PLT 195 09/04/2023   Lab Results  Component Value Date   CHOL 126 03/04/2024   HDL 61 03/04/2024   LDLCALC 42 03/04/2024   TRIG 143 03/04/2024   CHOLHDL 2.1 03/04/2024   Lab Results  Component Value Date   TSH 2.03 03/04/2024       Latest Ref Rng & Units 03/04/2024    9:28 AM  Hepatic Function  Total Protein 6.1 - 8.1 g/dL 6.8   AST 10 - 35 U/L 25   ALT  6 - 29 U/L 23   Total Bilirubin 0.2 - 1.2 mg/dL 0.5   Bilirubin, Direct 0.0 - 0.2 mg/dL 0.1    Assessment & Plan:   Hypertension not currently on pharmacological treatment, previously has been on Losartan . Blood Pressure: normotensive today at 130/70.   Hyperlipidemia treated with Atorvastatin  40 mg daily. 03/04/2024 Lipid Panel: WNL.   Hypothyroidism treated with Levothyroxine  25 mcg daily. 03/04/2024 TSH: 2.03.  Chronic Kidney Disease, stage IIIa w/ Creatinine 1.24 and eGFR 45 in January 2025. Saw Dr. Prescilla in 2021 with PRN follow-up.   Will review again at annual visit.   Reviewed 03/03/2024 Hepatic Function Panel: WNL.   Mammogram 11/05/2023 normal.   Depression treated with Doxepin 25 mg nightly, Nortriptyline 75 mg at bedtime, and also takes Gabapentin 800 mg at bedtime. Followed by KEN Elveria Her, who she last saw in 01/2024.  History of SIBO followed by Dr. Belvie Seip, GI. Chronic Constipation treated with 5.5 g of Miralax twice daily.  Mild Rotator Cuff Tear/Rupture, Right Shoulder treated with OTC Tylenol as needed,  Osteopenia treated with Calcium  Carbonate 300 mg daily w/ Vitamin-D 1000 units daily. Bone Density 07/20/20 with T-score -2.0, osteopenic.  Vaccine Counseling: Covid-19 and Influenza discussed - she does plan  on updating both.  Return in about 27 weeks (around 09/15/2024) for annual labs, and then on 09/20/2024 for annual visit, or as needed.    I,Emily Lagle,acting as a Neurosurgeon for Ronal JINNY Hailstone, MD.,have documented all relevant documentation on the behalf of Ronal JINNY Hailstone, MD,as directed by  Ronal JINNY Hailstone, MD while in the presence of Ronal JINNY Hailstone, MD.   I, Ronal JINNY Hailstone, MD, have reviewed all documentation for this visit. The documentation on 03/10/24 for the exam, diagnosis, procedures, and orders are all accurate and complete.

## 2024-03-11 NOTE — Patient Instructions (Signed)
 It was a pleasure to see you today. Medical issues seem to be stable. Return in 6 months for Medicare wellness visit and Annual exam. Vaccines discussed.

## 2024-03-14 DIAGNOSIS — Z85828 Personal history of other malignant neoplasm of skin: Secondary | ICD-10-CM | POA: Diagnosis not present

## 2024-03-14 DIAGNOSIS — C44622 Squamous cell carcinoma of skin of right upper limb, including shoulder: Secondary | ICD-10-CM | POA: Diagnosis not present

## 2024-03-14 DIAGNOSIS — L738 Other specified follicular disorders: Secondary | ICD-10-CM | POA: Diagnosis not present

## 2024-03-28 DIAGNOSIS — M75121 Complete rotator cuff tear or rupture of right shoulder, not specified as traumatic: Secondary | ICD-10-CM | POA: Diagnosis not present

## 2024-04-07 DIAGNOSIS — M75121 Complete rotator cuff tear or rupture of right shoulder, not specified as traumatic: Secondary | ICD-10-CM | POA: Diagnosis not present

## 2024-04-07 DIAGNOSIS — M6281 Muscle weakness (generalized): Secondary | ICD-10-CM | POA: Diagnosis not present

## 2024-04-14 DIAGNOSIS — M6281 Muscle weakness (generalized): Secondary | ICD-10-CM | POA: Diagnosis not present

## 2024-04-14 DIAGNOSIS — M75121 Complete rotator cuff tear or rupture of right shoulder, not specified as traumatic: Secondary | ICD-10-CM | POA: Diagnosis not present

## 2024-04-19 DIAGNOSIS — M6281 Muscle weakness (generalized): Secondary | ICD-10-CM | POA: Diagnosis not present

## 2024-04-19 DIAGNOSIS — M75121 Complete rotator cuff tear or rupture of right shoulder, not specified as traumatic: Secondary | ICD-10-CM | POA: Diagnosis not present

## 2024-04-21 DIAGNOSIS — M75121 Complete rotator cuff tear or rupture of right shoulder, not specified as traumatic: Secondary | ICD-10-CM | POA: Diagnosis not present

## 2024-04-21 DIAGNOSIS — M6281 Muscle weakness (generalized): Secondary | ICD-10-CM | POA: Diagnosis not present

## 2024-04-26 DIAGNOSIS — M6281 Muscle weakness (generalized): Secondary | ICD-10-CM | POA: Diagnosis not present

## 2024-04-26 DIAGNOSIS — M75121 Complete rotator cuff tear or rupture of right shoulder, not specified as traumatic: Secondary | ICD-10-CM | POA: Diagnosis not present

## 2024-04-27 DIAGNOSIS — F331 Major depressive disorder, recurrent, moderate: Secondary | ICD-10-CM | POA: Diagnosis not present

## 2024-04-28 DIAGNOSIS — M6281 Muscle weakness (generalized): Secondary | ICD-10-CM | POA: Diagnosis not present

## 2024-04-28 DIAGNOSIS — M75121 Complete rotator cuff tear or rupture of right shoulder, not specified as traumatic: Secondary | ICD-10-CM | POA: Diagnosis not present

## 2024-05-03 DIAGNOSIS — M6281 Muscle weakness (generalized): Secondary | ICD-10-CM | POA: Diagnosis not present

## 2024-05-03 DIAGNOSIS — M75121 Complete rotator cuff tear or rupture of right shoulder, not specified as traumatic: Secondary | ICD-10-CM | POA: Diagnosis not present

## 2024-05-04 DIAGNOSIS — Z23 Encounter for immunization: Secondary | ICD-10-CM | POA: Diagnosis not present

## 2024-05-05 DIAGNOSIS — M6281 Muscle weakness (generalized): Secondary | ICD-10-CM | POA: Diagnosis not present

## 2024-05-05 DIAGNOSIS — M75121 Complete rotator cuff tear or rupture of right shoulder, not specified as traumatic: Secondary | ICD-10-CM | POA: Diagnosis not present

## 2024-05-09 DIAGNOSIS — M75121 Complete rotator cuff tear or rupture of right shoulder, not specified as traumatic: Secondary | ICD-10-CM | POA: Diagnosis not present

## 2024-05-23 DIAGNOSIS — Z23 Encounter for immunization: Secondary | ICD-10-CM | POA: Diagnosis not present

## 2024-05-27 DIAGNOSIS — M6281 Muscle weakness (generalized): Secondary | ICD-10-CM | POA: Diagnosis not present

## 2024-05-27 DIAGNOSIS — M75121 Complete rotator cuff tear or rupture of right shoulder, not specified as traumatic: Secondary | ICD-10-CM | POA: Diagnosis not present

## 2024-06-03 DIAGNOSIS — M6281 Muscle weakness (generalized): Secondary | ICD-10-CM | POA: Diagnosis not present

## 2024-06-03 DIAGNOSIS — M75121 Complete rotator cuff tear or rupture of right shoulder, not specified as traumatic: Secondary | ICD-10-CM | POA: Diagnosis not present

## 2024-06-07 DIAGNOSIS — M6281 Muscle weakness (generalized): Secondary | ICD-10-CM | POA: Diagnosis not present

## 2024-06-07 DIAGNOSIS — M75121 Complete rotator cuff tear or rupture of right shoulder, not specified as traumatic: Secondary | ICD-10-CM | POA: Diagnosis not present

## 2024-06-14 DIAGNOSIS — M6281 Muscle weakness (generalized): Secondary | ICD-10-CM | POA: Diagnosis not present

## 2024-06-14 DIAGNOSIS — M75121 Complete rotator cuff tear or rupture of right shoulder, not specified as traumatic: Secondary | ICD-10-CM | POA: Diagnosis not present

## 2024-06-16 DIAGNOSIS — M6281 Muscle weakness (generalized): Secondary | ICD-10-CM | POA: Diagnosis not present

## 2024-06-16 DIAGNOSIS — M75121 Complete rotator cuff tear or rupture of right shoulder, not specified as traumatic: Secondary | ICD-10-CM | POA: Diagnosis not present

## 2024-06-21 DIAGNOSIS — M6281 Muscle weakness (generalized): Secondary | ICD-10-CM | POA: Diagnosis not present

## 2024-06-21 DIAGNOSIS — M75121 Complete rotator cuff tear or rupture of right shoulder, not specified as traumatic: Secondary | ICD-10-CM | POA: Diagnosis not present

## 2024-06-23 DIAGNOSIS — M75121 Complete rotator cuff tear or rupture of right shoulder, not specified as traumatic: Secondary | ICD-10-CM | POA: Diagnosis not present

## 2024-06-23 DIAGNOSIS — M6281 Muscle weakness (generalized): Secondary | ICD-10-CM | POA: Diagnosis not present

## 2024-06-28 ENCOUNTER — Ambulatory Visit: Admitting: Internal Medicine

## 2024-06-28 ENCOUNTER — Encounter: Payer: Self-pay | Admitting: Internal Medicine

## 2024-06-28 VITALS — BP 110/80 | HR 81 | Temp 98.2°F | Ht 64.0 in | Wt 127.0 lb

## 2024-06-28 DIAGNOSIS — N183 Chronic kidney disease, stage 3 unspecified: Secondary | ICD-10-CM

## 2024-06-28 DIAGNOSIS — J069 Acute upper respiratory infection, unspecified: Secondary | ICD-10-CM | POA: Diagnosis not present

## 2024-06-28 DIAGNOSIS — Z7185 Encounter for immunization safety counseling: Secondary | ICD-10-CM

## 2024-06-28 DIAGNOSIS — J3089 Other allergic rhinitis: Secondary | ICD-10-CM

## 2024-06-28 DIAGNOSIS — E039 Hypothyroidism, unspecified: Secondary | ICD-10-CM

## 2024-06-28 MED ORDER — AZITHROMYCIN 250 MG PO TABS: ORAL_TABLET | ORAL | 0 refills | Status: AC

## 2024-06-28 NOTE — Progress Notes (Signed)
 Patient Care Team: Perri Ronal PARAS, MD as PCP - General  Visit Date: 06/28/24  Subjective:    Patient ID: [name] [name] , Female   DOB: 1946/07/16, 78 y.o.    MRN: [id number]   78 y.o. Female presents today for congestion. Patient has a past medical history of Hypothyroidism, Depression, Hyperlipidemia.  She has became sick on Thursday. She was outdoor cleaning leaves so she thought it was allergies. Today she sounds very horse. She has congestion. She denies fever, headache, chills, cough. She is currently on prednisone due to right upper arm pain, she is about to take the last dose.   Vaccine counseling: UTD on Covid-19, Influenza and RSV vaccines.  Past Medical History:  Diagnosis Date   Anxiety state, unspecified    Bilateral shoulder pain    Bipolar disorder, unspecified (HCC)    Depressive disorder, not elsewhere classified    Diarrhea    Esophageal reflux    Hyperlipidemia    Hypertension    Irritable bowel syndrome    Personal history of colonic polyps 2005   hyperplastic   Thyroid  disease    Unspecified hemorrhoids without mention of complication      Family History  Problem Relation Age of Onset   Kidney disease Brother    Heart disease Father    Arthritis Mother    Colon cancer Neg Hx     Social History   Social History Narrative   Social history: Does not smoke or consume alcohol.  Resides alone.  Is divorced.  Used to work as a runner, broadcasting/film/video.  Used to sing in the choir at [facility] but since Kathryn came about she has not been doing that.  Completed 4 years of college.       Family history: Mother with history of nervous breakdown.  Father with history of hypertension.  Brother with hypertension.  1 brother apparently died with end-stage kidney disease and was on lithium with history of alcohol abuse.          Review of Systems  Constitutional:  Positive for malaise/fatigue.  HENT:  Positive for congestion.         Objective:    Vitals: BP 110/80   Pulse 81   Temp 98.2 F (36.8 C)   Ht 5' 4 (1.626 m)   Wt 127 lb (57.6 kg)   SpO2 97%   BMI 21.80 kg/m    Physical Exam Vitals and nursing note reviewed.  Constitutional:      General: She is not in acute distress.    Appearance: Normal appearance. She is not ill-appearing.  HENT:     Head: Normocephalic and atraumatic.     Right Ear: Tympanic membrane, ear canal and external ear normal.     Left Ear: Tympanic membrane, ear canal and external ear normal.     Mouth/Throat:     Mouth: Mucous membranes are moist.     Pharynx: Oropharynx is clear. Posterior oropharyngeal erythema present. No oropharyngeal exudate.  Pulmonary:     Effort: Pulmonary effort is normal.     Breath sounds: Normal breath sounds. No wheezing, rhonchi or rales.  Lymphadenopathy:     Cervical: No cervical adenopathy.  Skin:    General: Skin is warm and dry.  Neurological:     Mental Status: She is alert and oriented to person, place, and time. Mental status is at baseline.  Psychiatric:        Mood and Affect: Mood normal.  Behavior: Behavior normal.        Thought Content: Thought content normal.        Judgment: Judgment normal.       Results:    Labs:       Component Value Date/Time   NA 141 09/04/2023 0912   K 4.6 09/04/2023 0912   CL 103 09/04/2023 0912   CO2 27 09/04/2023 0912   GLUCOSE 96 09/04/2023 0912   BUN 18 09/08/2023 1111   CREATININE 1.24 (H) 09/08/2023 1111   CALCIUM  9.5 09/04/2023 0912   PROT 6.8 03/04/2024 0928   ALBUMIN 4.2 06/17/2016 1143   AST 25 03/04/2024 0928   ALT 23 03/04/2024 0928   ALKPHOS 58 06/17/2016 1143   BILITOT 0.5 03/04/2024 0928   GFRNONAA 44 (L) 08/16/2020 1006   GFRAA 51 (L) 08/16/2020 1006     Lab Results  Component Value Date   WBC 6.7 09/04/2023   HGB 14.0 09/04/2023   HCT 42.8 09/04/2023   MCV 94.5 09/04/2023   PLT 195 09/04/2023    Lab Results  Component Value Date   CHOL 126 03/04/2024   HDL 61  03/04/2024   LDLCALC 42 03/04/2024   TRIG 143 03/04/2024   CHOLHDL 2.1 03/04/2024    Lab Results  Component Value Date   TSH 2.03 03/04/2024       Assessment & Plan:   Meds ordered this encounter  Medications   azithromycin  (ZITHROMAX ) 250 MG tablet    Sig: Take 2 tablets on day 1, then 1 tablet daily on days 2 through 5    Dispense:  6 tablet    Refill:  0    Acute upper respiratory infection: She has became ill  on Thursday. She was outdoors clearing leaves, so she thought symptoms were allergies. Today, she sounds very hoarse. She has congestion. She denies fever, headache, chills, cough. She is currently on Prednisone due to right upper arm pain, she is about to take the last dose.   Have prescribed today: Azithromycin  250 mg  to take 2 tablets on the first day, then 1 tablet daily for days 2-5. Rest and stay well hydrated. Call if not better in 5-7 days or sooner if worse.  Vaccine counseling: UTD on Covid-19, Influenza and RSV vaccines.  History of chronic kidney disease stage III-has been evaluated [facility] in 2021 and felt to be stable.  We monitor her kidney functions on an annual basis and they have been stable.  Hypothyroidism treated with thyroid  replacement medication  History of Allergic rhinitis due to dust  I,Makayla C Reid,acting as a scribe for Ronal JINNY Hailstone, MD.,have documented all relevant documentation on the behalf of Ronal JINNY Hailstone, MD,as directed by  Ronal JINNY Hailstone, MD while in the presence of Ronal JINNY Hailstone, MD.

## 2024-06-30 DIAGNOSIS — M6281 Muscle weakness (generalized): Secondary | ICD-10-CM | POA: Diagnosis not present

## 2024-06-30 DIAGNOSIS — M75121 Complete rotator cuff tear or rupture of right shoulder, not specified as traumatic: Secondary | ICD-10-CM | POA: Diagnosis not present

## 2024-07-09 ENCOUNTER — Encounter: Payer: Self-pay | Admitting: Internal Medicine

## 2024-07-09 NOTE — Patient Instructions (Signed)
 You have been diagnosed with an acute upper respiratory infection.  You have been prescribed azithromycin  250 mg to take 2 tablets the first day and then 1 tablet days 2 through 5.  Rest and stay well-hydrated.  Call if not better in 5 to 7 days or sooner if worse.  Vaccines reviewed.

## 2024-07-18 DIAGNOSIS — L738 Other specified follicular disorders: Secondary | ICD-10-CM | POA: Diagnosis not present

## 2024-07-18 DIAGNOSIS — L820 Inflamed seborrheic keratosis: Secondary | ICD-10-CM | POA: Diagnosis not present

## 2024-07-18 DIAGNOSIS — Z85828 Personal history of other malignant neoplasm of skin: Secondary | ICD-10-CM | POA: Diagnosis not present

## 2024-07-18 DIAGNOSIS — D1801 Hemangioma of skin and subcutaneous tissue: Secondary | ICD-10-CM | POA: Diagnosis not present

## 2024-07-18 DIAGNOSIS — D2271 Melanocytic nevi of right lower limb, including hip: Secondary | ICD-10-CM | POA: Diagnosis not present

## 2024-07-18 DIAGNOSIS — L821 Other seborrheic keratosis: Secondary | ICD-10-CM | POA: Diagnosis not present

## 2024-09-06 NOTE — Progress Notes (Incomplete)
 "  Annual Wellness Visit   Patient Care Team: Perri Ronal PARAS, MD as PCP - General  Visit Date: 09/06/24   No chief complaint on file.  Subjective:  Patient: [name] [name], Female DOB: 09/26/1945, 79 y.o. MRN: [id number] There were no vitals filed for this visit. [name] [name] is a 79 y.o. Female who presents today for her Annual Wellness Visit. Patient has Anxiety state; DEPRESSION; HEMORRHOIDS; GERD; History of colonic polyps; Hypothyroidism; HTN (hypertension); Allergic rhinitis; Hypertriglyceridemia; PTSD (post-traumatic stress disorder); and Small intestinal bacterial overgrowth (SIBO) on their problem list.  Hypothyroidism treated with Levothyroxine  25 mcg daily.    History of Depression treated with Doxepin 25 mg nightly and Pamelor 75 mg at bedtime. Also takes Neurontin 800 mg at bedtime. Followed by Dr Keane. Notes that her mother was a chronic worrier, which she seems to have picked up on. Is anxious today because her elevated creatinine;  has seen Dr. Prescilla once in the past, who also suggested her elevation may be due to decreased kidney function attributed to varying reasons. Knows to avoid NSAIDs. Notes that she doesn't drink water regularly throughout the day, only when taking her medications. Has 1 cup of coffee in the mornings and 1 large [employer] tea for lunch and some for dinner. Hasn't followed-up with [facility] as she wasn't instructed to by them to. Also notes her urine volume and frequency has increased since she has started drinking more water throughout the day.   Hyperlipidemia treated with Lipitor 40 mg daily.    History of Chronic Constipation treated with 1/2 cap of Miralax in the morning and afternoon. Followed by Belvie Seip with [facility], who also follows her SIBO.   Complete Rotator Cuff Tear or Rupture of Right Shoulder treated with Tylenol.   Labs ***/***/*** {Labs (Optional):31667}   11/04/2023 Mammogram No mammographic evidence of  malignancy. Repeat on one year.     Bone Density 07/20/20 with T-score -2.0, osteopenic. Osteopenia treated with 300 mg OS-CAL daily.  Remote history of fractured wrist.   12/29/2012 Colonoscopy Normal Colon; Multiple biopsies of the rectal nodules lesion was preformed, probable hyperplastic nodules. Melanosis coli also noted. Pathology found to be hyperplastic polyp. Repeat in 5 years.    Health Maintenance  Topic Date Due   COVID-19 Vaccine (9 - 2025-26 season) 04/11/2024   Medicare Annual Wellness (AWV)  09/09/2024   Mammogram  11/03/2024   DTaP/Tdap/Td (4 - Td or Tdap) 08/29/2032   Pneumococcal Vaccine: 50+ Years  Completed   Influenza Vaccine  Completed   Bone Density Scan  Completed   Zoster Vaccines- Shingrix  Completed   Meningococcal B Vaccine  Aged Out   Colonoscopy  Discontinued   Hepatitis C Screening  Discontinued    {Man or Woman:32389}  Vaccine Counseling: Due for {Vaccines:32291::Influenza}; UTD on {Vaccines:32291::Influenza}  ROS Objective:  Vitals: body mass index is unknown because there is no height or weight on file.There were no vitals filed for this visit. Physical Exam  Current Outpatient Medications  Medication Instructions   atorvastatin  (LIPITOR) 40 mg, Oral, Daily   calcium  carbonate (OS-CAL) 300 mg, Daily with breakfast   cholecalciferol (VITAMIN D ) 1,000 Units, Daily   doxepin (SINEQUAN) 25 mg, Daily   gabapentin (NEURONTIN) 800 mg, Daily at bedtime   levothyroxine  (SYNTHROID ) 25 mcg, Oral, Daily   nortriptyline (PAMELOR) 75 mg, Daily at bedtime   predniSONE (STERAPRED UNI-PAK 21 TAB) 10 mg, Daily   Past Medical History:  Diagnosis Date   Anxiety state,  unspecified    Bilateral shoulder pain    Bipolar disorder, unspecified (HCC)    Depressive disorder, not elsewhere classified    Diarrhea    Esophageal reflux    Hyperlipidemia    Hypertension    Irritable bowel syndrome    Personal history of colonic polyps 2005   hyperplastic    Thyroid  disease    Unspecified hemorrhoids without mention of complication    Medical/Surgical History Narrative:  Allergic/Intolerant to: Allergies[1] History of Allergic Rhinitis with reactions to dust, mold, and dust mites. Has been seen by Allergist.   GE Reflux not currently treated with prescription medication, but symptoms well controlled.  Past Surgical History:  Procedure Laterality Date   COLONOSCOPY     ESOPHAGOGASTRODUODENOSCOPY     Family History  Problem Relation Age of Onset   Kidney disease Brother    Heart disease Father    Arthritis Mother    Colon cancer Neg Hx    Family History Narrative: {ELFamHX:31110} Social History   Social History Narrative   Social history: Does not smoke or consume alcohol.  Resides alone.  Is divorced.  Used to work as a runner, broadcasting/film/video.  Used to sing in the choir at [facility] [employer] [facility] [employer] but since [facility] came about she has not been doing that.  Completed 4 years of college.       Family history: Mother with history of nervous breakdown.  Father with history of hypertension.  Brother with hypertension.  1 brother apparently died with end-stage kidney disease and was on lithium with history of alcohol abuse.       Most Recent Health Risks Assessment:   Most Recent Social Determinants of Health (Including Hx of Tobacco, Alcohol, and Drug Use) SDOH Screenings   Food Insecurity: No Food Insecurity (09/02/2023)  Housing: Low Risk (09/10/2023)  Transportation Needs: No Transportation Needs (09/02/2023)  Utilities: Not At Risk (09/10/2023)  Depression (PHQ2-9): Low Risk (06/28/2024)  Financial Resource Strain: Low Risk (09/02/2023)  Physical Activity: Insufficiently Active (09/02/2023)  Social Connections: Moderately Integrated (09/02/2023)  Stress: No Stress Concern Present (09/02/2023)  Tobacco Use: Low Risk (07/09/2024)  Health Literacy: Adequate Health Literacy (09/02/2023)   Social History[2] Most Recent Functional Status Assessment:     09/10/2023    2:55 PM  In your present state of health, do you have any difficulty performing the following activities:  Hearing? 0  Vision? 0  Difficulty concentrating or making decisions? 0  Walking or climbing stairs? 0  Dressing or bathing? 0  Doing errands, shopping? 0  Preparing Food and eating ? N  Using the Toilet? N  In the past six months, have you accidently leaked urine? N  Do you have problems with loss of bowel control? N  Managing your Medications? N  Managing your Finances? N  Housekeeping or managing your Housekeeping? N   Most Recent Fall Risk Assessment:    09/10/2023    2:56 PM  Fall Risk   Falls in the past year? 1  Number falls in past yr: 0  Injury with Fall? 0   Risk for fall due to : Other (Comment)  Follow up Falls evaluation completed;Education provided;Falls prevention discussed     Data saved with a previous flowsheet row definition   Most Recent Anxiety/Depression Screenings:    06/28/2024    4:35 PM 09/04/2022    3:36 PM  PHQ 2/9 Scores  PHQ - 2 Score 0 0  PHQ- 9 Score 0  No data to display         Most Recent Cognitive Screening:    09/10/2023    3:00 PM  6CIT Screen  What Year? 0 points  What month? 0 points  What time? 0 points  Count back from 20 0 points  Months in reverse 0 points  Repeat phrase 0 points  Total Score 0 points   Most Recent Vision/Hearing Screenings:No results found. Results:  Studies Obtained And Personally Reviewed By Me: Diabetic Foot Exam - Simple   No data filed     {Imaging, colonoscopy, mammogram, bone density scan, echocardiogram, heart cath, stress test, CT calcium  score, etc.:32292}  Labs:  CBC w/ Differential Lab Results  Component Value Date   WBC 6.7 09/04/2023   RBC 4.53 09/04/2023   HGB 14.0 09/04/2023   HCT 42.8 09/04/2023   PLT 195 09/04/2023   MCV 94.5 09/04/2023   MCH 30.9 09/04/2023   MCHC 32.7 09/04/2023   RDW 11.2 09/04/2023   MPV 9.5 09/04/2023   LYMPHSABS  1,705 08/28/2022   MONOABS 308 06/17/2016   BASOSABS 40 09/04/2023    Comprehensive Metabolic Panel Lab Results  Component Value Date   NA 141 09/04/2023   K 4.6 09/04/2023   CL 103 09/04/2023   CO2 27 09/04/2023   GLUCOSE 96 09/04/2023   BUN 18 09/08/2023   CREATININE 1.24 (H) 09/08/2023   CALCIUM  9.5 09/04/2023   PROT 6.8 03/04/2024   ALBUMIN 4.2 06/17/2016   AST 25 03/04/2024   ALT 23 03/04/2024   ALKPHOS 58 06/17/2016   BILITOT 0.5 03/04/2024   EGFR 45 (L) 09/08/2023   GFRNONAA 44 (L) 08/16/2020   Lipid Panel  Lab Results  Component Value Date   CHOL 126 03/04/2024   HDL 61 03/04/2024   LDLCALC 42 03/04/2024   TRIG 143 03/04/2024   A1c No results found for: HGBA1C  TSH Lab Results  Component Value Date   TSH 2.03 03/04/2024   PSA{PSA (Optional):32132} No results found for any visits on 09/20/24. Assessment & Plan:  No orders of the defined types were placed in this encounter.  No orders of the defined types were placed in this encounter.  Other Labs Reviewed today:    No follow-ups on file.   Annual Wellness Visit done today including the all of the following: Reviewed patient's Family Medical History Reviewed patient's SDOH and reviewed tobacco, alcohol, and drug use.  Reviewed and updated list of patient's medical providers Assessment of cognitive impairment was done Assessed patient's functional ability Established a written schedule for health screening services Health Risk Assessent Completed and Reviewed  Discussed health benefits of physical activity, and encouraged her to engage in regular exercise appropriate for her age and condition.    I,Makayla C Reid,acting as a scribe for Ronal JINNY Hailstone, MD.,have documented all relevant documentation on the behalf of Ronal JINNY Hailstone, MD,as directed by  Ronal JINNY Hailstone, MD while in the presence of Ronal JINNY Hailstone, MD.  I, Ronal JINNY Hailstone, MD, have reviewed all documentation for and agree with the above  Annual Wellness Visit documentation.  Ronal JINNY Hailstone, MD Internal Medicine 09/20/2024    [1] No Known Allergies [2]  Social History Tobacco Use   Smoking status: Never   Smokeless tobacco: Never  Substance Use Topics   Alcohol use: No   Drug use: No   "

## 2024-09-14 ENCOUNTER — Telehealth: Payer: Self-pay | Admitting: Internal Medicine

## 2024-09-14 NOTE — Telephone Encounter (Signed)
 Done

## 2024-09-15 ENCOUNTER — Other Ambulatory Visit

## 2024-09-15 ENCOUNTER — Other Ambulatory Visit: Payer: Self-pay

## 2024-09-15 DIAGNOSIS — N183 Chronic kidney disease, stage 3 unspecified: Secondary | ICD-10-CM

## 2024-09-15 DIAGNOSIS — N1831 Chronic kidney disease, stage 3a: Secondary | ICD-10-CM

## 2024-09-15 DIAGNOSIS — Z0000 Encounter for general adult medical examination without abnormal findings: Secondary | ICD-10-CM

## 2024-09-15 DIAGNOSIS — M8580 Other specified disorders of bone density and structure, unspecified site: Secondary | ICD-10-CM

## 2024-09-15 DIAGNOSIS — E039 Hypothyroidism, unspecified: Secondary | ICD-10-CM

## 2024-09-15 DIAGNOSIS — E781 Pure hyperglyceridemia: Secondary | ICD-10-CM

## 2024-09-19 ENCOUNTER — Other Ambulatory Visit

## 2024-09-19 DIAGNOSIS — N183 Chronic kidney disease, stage 3 unspecified: Secondary | ICD-10-CM

## 2024-09-19 DIAGNOSIS — Z0000 Encounter for general adult medical examination without abnormal findings: Secondary | ICD-10-CM

## 2024-09-19 DIAGNOSIS — E781 Pure hyperglyceridemia: Secondary | ICD-10-CM

## 2024-09-19 DIAGNOSIS — E039 Hypothyroidism, unspecified: Secondary | ICD-10-CM

## 2024-09-19 DIAGNOSIS — M8580 Other specified disorders of bone density and structure, unspecified site: Secondary | ICD-10-CM

## 2024-09-20 ENCOUNTER — Ambulatory Visit: Payer: Self-pay | Admitting: Internal Medicine
